# Patient Record
Sex: Male | Born: 1990 | Race: Black or African American | Hispanic: No | Marital: Single | Smoking: Current every day smoker
Health system: Southern US, Community
[De-identification: ages and names within clinical notes are randomized; demographics above are authoritative.]

## PROBLEM LIST (undated history)

## (undated) ENCOUNTER — Ambulatory Visit (HOSPITAL_COMMUNITY): Payer: Medicaid Other

## (undated) DIAGNOSIS — J45909 Unspecified asthma, uncomplicated: Secondary | ICD-10-CM

## (undated) DIAGNOSIS — I10 Essential (primary) hypertension: Secondary | ICD-10-CM

## (undated) DIAGNOSIS — E119 Type 2 diabetes mellitus without complications: Secondary | ICD-10-CM

---

## 2018-01-20 ENCOUNTER — Emergency Department (HOSPITAL_BASED_OUTPATIENT_CLINIC_OR_DEPARTMENT_OTHER)
Admission: EM | Admit: 2018-01-20 | Discharge: 2018-01-21 | Disposition: A | Discharge status: Home / Self Care | Payer: Medicaid Other | Attending: Emergency Medicine | Admitting: Emergency Medicine

## 2018-01-20 ENCOUNTER — Encounter (HOSPITAL_BASED_OUTPATIENT_CLINIC_OR_DEPARTMENT_OTHER): Payer: Self-pay | Admitting: *Deleted

## 2018-01-20 ENCOUNTER — Other Ambulatory Visit: Payer: Self-pay

## 2018-01-20 DIAGNOSIS — I10 Essential (primary) hypertension: Secondary | ICD-10-CM | POA: Diagnosis not present

## 2018-01-20 DIAGNOSIS — F17210 Nicotine dependence, cigarettes, uncomplicated: Secondary | ICD-10-CM | POA: Diagnosis not present

## 2018-01-20 DIAGNOSIS — Z794 Long term (current) use of insulin: Secondary | ICD-10-CM | POA: Insufficient documentation

## 2018-01-20 DIAGNOSIS — E119 Type 2 diabetes mellitus without complications: Secondary | ICD-10-CM | POA: Diagnosis not present

## 2018-01-20 DIAGNOSIS — L299 Pruritus, unspecified: Secondary | ICD-10-CM | POA: Diagnosis present

## 2018-01-20 HISTORY — DX: Type 2 diabetes mellitus without complications: E11.9

## 2018-01-20 HISTORY — DX: Essential (primary) hypertension: I10

## 2018-01-20 LAB — CBG MONITORING, ED
GLUCOSE-CAPILLARY: 194 mg/dL — AB (ref 65–99)
Glucose-Capillary: 244 mg/dL — ABNORMAL HIGH (ref 65–99)

## 2018-01-20 MED ORDER — ONDANSETRON HCL 4 MG/2ML IJ SOLN
4.0000 mg | Freq: Once | INTRAMUSCULAR | Status: AC
  Administered 2018-01-20: 4 mg via INTRAVENOUS
  Filled 2018-01-20: qty 2

## 2018-01-20 MED ORDER — DIPHENHYDRAMINE HCL 50 MG/ML IJ SOLN
25.0000 mg | Freq: Once | INTRAMUSCULAR | Status: AC
  Administered 2018-01-20: 25 mg via INTRAVENOUS
  Filled 2018-01-20: qty 1

## 2018-01-20 MED ORDER — SODIUM CHLORIDE 0.9 % IV BOLUS (SEPSIS)
1000.0000 mL | Freq: Once | INTRAVENOUS | Status: AC
  Administered 2018-01-20: 1000 mL via INTRAVENOUS

## 2018-01-20 NOTE — ED Provider Notes (Signed)
[facility] EMERGENCY DEPARTMENT Provider Note   CSN: [id number] Arrival date & time: 01/20/18  2007     History   Chief Complaint Chief Complaint  Patient presents with  . Pruritis    HPI Francisco Davila is a 27 y.o. male.  HPI   27 year old male with history of diabetes, hypertension presenting complaining of itchiness.  Patient states that he ate an [place] sandwich at lunch and shortly afterward he developed itchiness throughout his entire body.  Furthermore, he also has been feeling nauseous and has vomited multiple times.  Vomitus is nonbloody nonbilious.  He feels dehydrated.  Itchiness has improved but not fully resolved.  No headache, lightheadedness, dizziness, chest pain, shortness of breath but does endorse some mild abdominal discomfort from vomiting.  Admits that he has history of diabetes and felt that his blood sugar is elevated.  States he took his medication earlier but vomited up.  He denies any prior known allergies.  States that he has been compliant with his medication and his diabetes have been under control.  Past Medical History:  Diagnosis Date  . Diabetes mellitus without complication (HCC)   . Hypertension     There are no active problems to display for this patient.   History reviewed. No pertinent surgical history.     Home Medications    Prior to Admission medications   Medication Sig Start Date End Date Taking? Authorizing Provider  Insulin NPH Isophane & Regular (HUMULIN 70/30 [place]) Inject into the skin.   Yes [provider]  lisinopril (PRINIVIL,ZESTRIL) 20 MG tablet Take 20 mg by mouth daily.   Yes [provider]  metFORMIN (GLUCOPHAGE) 500 MG tablet Take 500 mg by mouth 2 (two) times daily with a meal.   Yes [provider]  simvastatin (ZOCOR) 40 MG tablet Take 40 mg by mouth daily.   Yes [provider]    Family History History reviewed. No pertinent family history.  Social  History Social History   Tobacco Use  . Smoking status: Current Every Day Smoker    Types: Cigarettes, Cigars  . Smokeless tobacco: Never Used  Substance Use Topics  . Alcohol use: No    Frequency: Never  . Drug use: Yes    Types: Marijuana     Allergies   Patient has no known allergies.   Review of Systems Review of Systems  All other systems reviewed and are negative.    Physical Exam Updated Vital Signs BP (!) 147/95 (BP Location: Right Arm)   Pulse (!) 107   Temp 98.6 F (37 C) (Oral)   Resp 20   Ht 5\' 11"  (1.803 m)   Wt 87.2 kg (192 lb 3.9 oz)   SpO2 98%   BMI 26.81 kg/m   Physical Exam  Constitutional: He appears well-developed and well-nourished. No distress.  HENT:  Head: Atraumatic.  Mouth is dry  Eyes: Conjunctivae are normal.  Neck: Neck supple.  Cardiovascular:  Mild tachycardia without murmur rubs or gallops  Pulmonary/Chest: Effort normal and breath sounds normal. He has no wheezes. He has no rales.  Abdominal: Soft. Bowel sounds are normal. He exhibits no distension. There is tenderness (Mild generalized abdominal tenderness without guarding or rebound tenderness.  Negative Murphy sign, no pain at McBurney's point.).  Neurological: He is alert.  Skin: No rash noted.  No urticarial rash  Psychiatric: He has a normal mood and affect.  Nursing note and vitals reviewed.    ED Treatments / Results  Labs (all labs ordered are listed, but only abnormal results are displayed) Labs Reviewed  CBG MONITORING, ED - Abnormal; Notable for the following components:      Result Value   Glucose-Capillary 244 (*)    All other components within normal limits  CBG MONITORING, ED - Abnormal; Notable for the following components:   Glucose-Capillary 194 (*)    All other components within normal limits    EKG  EKG Interpretation None       Radiology No results found.  Procedures Procedures (including critical care time)  Medications Ordered  in ED Medications  sodium chloride 0.9 % bolus 1,000 mL (0 mLs Intravenous Stopped 01/20/18 2323)  ondansetron (ZOFRAN) injection 4 mg (4 mg Intravenous Given 01/20/18 2218)  diphenhydrAMINE (BENADRYL) injection 25 mg (25 mg Intravenous Given 01/20/18 2218)     Initial Impression / Assessment and Plan / ED Course  I have reviewed the triage vital signs and the nursing notes.  Pertinent labs & imaging results that were available during my care of the patient were reviewed by me and considered in my medical decision making (see chart for details).     BP 132/79 (BP Location: Right Arm)   Pulse 96   Temp 100 F (37.8 C) (Oral)   Resp 18   Ht 5\' 11"  (1.803 m)   Wt 87.2 kg (192 lb 3.9 oz)   SpO2 96%   BMI 26.81 kg/m    Final Clinical Impressions(s) / ED Diagnoses   Final diagnoses:  Pruritus    ED Discharge Orders        Ordered    diphenhydrAMINE (BENADRYL) 25 MG tablet  Every 6 hours PRN     01/21/18 0009    ondansetron (ZOFRAN) 4 MG tablet  Every 8 hours PRN     01/21/18 0010     10:02 PM Patient complaining of itchiness after eating a intolerance in which earlier today.  No urticarial rash noted.  He does complain of abdominal discomfort with persistent vomiting.  No airway compromise.  I am unsure if this is related to an allergic reaction or related to his hyperglycemia as his CBG is 244.  I have low suspicion for anaphylactic shock causing abdominal cramping and vomiting.  Patient will be treated symptomatically with IV fluid, Benadryl, and Zofran and will monitor.  Low suspicion for biliary disease causing his symptoms.  His abdominal discomfort may be secondary to his hyperglycemia.  11:45 PM Pt given IVF, zofran, benadryl for his sxs.  He is currently resting comfortably.  sxs resolved.  CBG improves to 194.  No abd pain.     Fayrene Helper, PA-C 01/21/18 0011    Nira Conn, MD 01/21/18 [phone]

## 2018-01-20 NOTE — ED Triage Notes (Signed)
Pt c/o itching and rash x 8 hrs ago after eating sandwich for lunch. States he feels better now

## 2018-01-21 MED ORDER — DIPHENHYDRAMINE HCL 25 MG PO TABS: 25.0000 mg | ORAL_TABLET | Freq: Four times a day (QID) | ORAL | 0 refills | Status: AC | PRN

## 2018-01-21 MED ORDER — ONDANSETRON HCL 4 MG PO TABS: 4.0000 mg | ORAL_TABLET | Freq: Three times a day (TID) | ORAL | 0 refills | Status: DC | PRN

## 2018-01-21 NOTE — Discharge Instructions (Signed)
You have been evaluated for your itchiness.  It may have been caused by an allergic reaction. Please take benadryl as needed for itchiness.  Your blood sugar is elevated today, check your blood sugar regularly and take your diabetic medication as prescribed.  Take zofran as needed for nausea.  Return if you have any concerns.

## 2018-12-25 ENCOUNTER — Emergency Department (HOSPITAL_BASED_OUTPATIENT_CLINIC_OR_DEPARTMENT_OTHER)
Admission: EM | Admit: 2018-12-25 | Discharge: 2018-12-25 | Disposition: A | Discharge status: Home / Self Care | Payer: Medicaid Other | Attending: Emergency Medicine | Admitting: Emergency Medicine

## 2018-12-25 ENCOUNTER — Other Ambulatory Visit: Payer: Self-pay

## 2018-12-25 ENCOUNTER — Encounter (HOSPITAL_BASED_OUTPATIENT_CLINIC_OR_DEPARTMENT_OTHER): Payer: Self-pay | Admitting: *Deleted

## 2018-12-25 DIAGNOSIS — Z79899 Other long term (current) drug therapy: Secondary | ICD-10-CM | POA: Diagnosis not present

## 2018-12-25 DIAGNOSIS — K529 Noninfective gastroenteritis and colitis, unspecified: Secondary | ICD-10-CM

## 2018-12-25 DIAGNOSIS — I10 Essential (primary) hypertension: Secondary | ICD-10-CM | POA: Diagnosis not present

## 2018-12-25 DIAGNOSIS — Z7984 Long term (current) use of oral hypoglycemic drugs: Secondary | ICD-10-CM | POA: Diagnosis not present

## 2018-12-25 DIAGNOSIS — E119 Type 2 diabetes mellitus without complications: Secondary | ICD-10-CM | POA: Insufficient documentation

## 2018-12-25 DIAGNOSIS — F17210 Nicotine dependence, cigarettes, uncomplicated: Secondary | ICD-10-CM | POA: Insufficient documentation

## 2018-12-25 DIAGNOSIS — R112 Nausea with vomiting, unspecified: Secondary | ICD-10-CM | POA: Diagnosis present

## 2018-12-25 LAB — URINALYSIS, ROUTINE W REFLEX MICROSCOPIC
Bilirubin Urine: NEGATIVE
Glucose, UA: NEGATIVE mg/dL
Hgb urine dipstick: NEGATIVE
Ketones, ur: NEGATIVE mg/dL
Leukocytes, UA: NEGATIVE
NITRITE: NEGATIVE
PH: 5.5 (ref 5.0–8.0)
Protein, ur: 100 mg/dL — AB

## 2018-12-25 LAB — CBC
HCT: 51.9 % (ref 39.0–52.0)
HEMOGLOBIN: 15.5 g/dL (ref 13.0–17.0)
MCH: 22.5 pg — AB (ref 26.0–34.0)
MCHC: 29.9 g/dL — ABNORMAL LOW (ref 30.0–36.0)
MCV: 75.2 fL — ABNORMAL LOW (ref 80.0–100.0)
Platelets: 322 10*3/uL (ref 150–400)
RBC: 6.9 MIL/uL — AB (ref 4.22–5.81)
RDW: 17.7 % — ABNORMAL HIGH (ref 11.5–15.5)
WBC: 9.8 10*3/uL (ref 4.0–10.5)
nRBC: 0 % (ref 0.0–0.2)

## 2018-12-25 LAB — LIPASE, BLOOD: LIPASE: 21 U/L (ref 11–51)

## 2018-12-25 LAB — COMPREHENSIVE METABOLIC PANEL
ALBUMIN: 4.1 g/dL (ref 3.5–5.0)
ALT: 12 U/L (ref 0–44)
ANION GAP: 8 (ref 5–15)
AST: 16 U/L (ref 15–41)
Alkaline Phosphatase: 64 U/L (ref 38–126)
BILIRUBIN TOTAL: 0.7 mg/dL (ref 0.3–1.2)
BUN: 13 mg/dL (ref 6–20)
CO2: 26 mmol/L (ref 22–32)
Calcium: 8.9 mg/dL (ref 8.9–10.3)
Chloride: 104 mmol/L (ref 98–111)
Creatinine, Ser: 0.78 mg/dL (ref 0.61–1.24)
GFR calc Af Amer: >60 mL/min (ref >60)
GFR calc non Af Amer: >60 mL/min (ref >60)
GLUCOSE: 136 mg/dL — AB (ref 70–99)
POTASSIUM: 3.4 mmol/L — AB (ref 3.5–5.1)
Sodium: 138 mmol/L (ref 135–145)
TOTAL PROTEIN: 7.2 g/dL (ref 6.5–8.1)

## 2018-12-25 LAB — URINALYSIS, MICROSCOPIC (REFLEX)

## 2018-12-25 LAB — CBG MONITORING, ED: GLUCOSE-CAPILLARY: 90 mg/dL (ref 70–99)

## 2018-12-25 MED ORDER — SODIUM CHLORIDE 0.9% FLUSH
3.0000 mL | Freq: Once | INTRAVENOUS | Status: DC
  Filled 2018-12-25: qty 3

## 2018-12-25 MED ORDER — POTASSIUM CHLORIDE CRYS ER 20 MEQ PO TBCR
40.0000 meq | EXTENDED_RELEASE_TABLET | Freq: Once | ORAL | Status: AC
  Administered 2018-12-25: 40 meq via ORAL
  Filled 2018-12-25: qty 2

## 2018-12-25 MED ORDER — LIDOCAINE VISCOUS HCL 2 % MT SOLN
15.0000 mL | Freq: Once | OROMUCOSAL | Status: AC
Start: 2018-12-25 — End: 2018-12-25
  Administered 2018-12-25: 15 mL via ORAL
  Filled 2018-12-25: qty 15

## 2018-12-25 MED ORDER — SODIUM CHLORIDE 0.9 % IV BOLUS
1000.0000 mL | Freq: Once | INTRAVENOUS | Status: AC
  Administered 2018-12-25: 1000 mL via INTRAVENOUS

## 2018-12-25 MED ORDER — ALUM & MAG HYDROXIDE-SIMETH 200-200-20 MG/5ML PO SUSP
30.0000 mL | Freq: Once | ORAL | Status: AC
  Administered 2018-12-25: 30 mL via ORAL
  Filled 2018-12-25: qty 30

## 2018-12-25 MED ORDER — ONDANSETRON 4 MG PO TBDP: 4.0000 mg | ORAL_TABLET | Freq: Three times a day (TID) | ORAL | 0 refills | Status: DC | PRN

## 2018-12-25 MED ORDER — ONDANSETRON HCL 4 MG/2ML IJ SOLN
4.0000 mg | Freq: Once | INTRAMUSCULAR | Status: AC
  Administered 2018-12-25: 4 mg via INTRAVENOUS
  Filled 2018-12-25: qty 2

## 2018-12-25 MED ORDER — PANTOPRAZOLE SODIUM 40 MG PO TBEC: 40.0000 mg | DELAYED_RELEASE_TABLET | Freq: Every day | ORAL | 0 refills | Status: DC

## 2018-12-25 NOTE — ED Triage Notes (Signed)
Pt reports cold sx earlier this week. Reports upper abd pain, nausea, and diarrhea x 3 days.

## 2018-12-25 NOTE — ED Provider Notes (Signed)
[facility] EMERGENCY DEPARTMENT Provider Note   CSN: [id number] Arrival date & time: 12/25/18  2025     History   Chief Complaint Chief Complaint  Patient presents with  . Abdominal Pain    HPI Francisco Davila is a 28 y.o. male.  HPI  A 28 year old male presents with diarrhea, nausea and abdominal pain.  Started off with cold-like symptoms earlier in the week.  Starting 2 days ago he is developed the nausea without vomiting as well as cramping upper abdominal pain with numerous loose stools.  No blood or mucus.  No fevers or recent travel.  The abdominal pain seems to be constant.  Tried some Pepto-Bismol without relief.  Past Medical History:  Diagnosis Date  . Diabetes mellitus without complication (HCC)   . Hypertension     There are no active problems to display for this patient.   History reviewed. No pertinent surgical history.      Home Medications    Prior to Admission medications   Medication Sig Start Date End Date Taking? Authorizing Provider  metFORMIN (GLUCOPHAGE) 1000 MG tablet Take by mouth. 10/30/16  Yes [provider]  diphenhydrAMINE (BENADRYL) 25 MG tablet Take 1 tablet (25 mg total) by mouth every 6 (six) hours as needed for itching or allergies. 01/21/18   Fayrene Helperran, [place], PA-C  Insulin NPH Isophane & Regular (HUMULIN 70/30 [place]) Inject into the skin.    [provider]  lisinopril (PRINIVIL,ZESTRIL) 20 MG tablet Take 20 mg by mouth daily.    [provider]  metFORMIN (GLUCOPHAGE) 500 MG tablet Take 500 mg by mouth 2 (two) times daily with a meal.    [provider]  ondansetron (ZOFRAN ODT) 4 MG disintegrating tablet Take 1 tablet (4 mg total) by mouth every 8 (eight) hours as needed. 12/25/18   Pricilla LovelessGoldston, Maicol Bowland, MD  ondansetron (ZOFRAN) 4 MG tablet Take 1 tablet (4 mg total) by mouth every 8 (eight) hours as needed for nausea or vomiting. 01/21/18   Fayrene Helperran, [place], PA-C  pantoprazole (PROTONIX) 40 MG tablet Take  1 tablet (40 mg total) by mouth daily. 12/25/18   Pricilla LovelessGoldston, Dillard Pascal, MD  simvastatin (ZOCOR) 40 MG tablet Take 40 mg by mouth daily.    [provider]    Family History No family history on file.  Social History Social History   Tobacco Use  . Smoking status: Current Every Day Smoker    Types: Cigars  . Smokeless tobacco: Never Used  Substance Use Topics  . Alcohol use: Yes    Frequency: Never  . Drug use: Not Currently    Types: Marijuana     Allergies   Patient has no known allergies.   Review of Systems Review of Systems  Constitutional: Negative for fever.  Gastrointestinal: Positive for abdominal pain, diarrhea and nausea. Negative for blood in stool and vomiting.  All other systems reviewed and are negative.    Physical Exam Updated Vital Signs BP 126/83 (BP Location: Left Arm)   Pulse 70   Temp 98.5 F (36.9 C) (Oral)   Resp 16   Ht 6' (1.829 m)   Wt 84.8 kg   SpO2 100%   BMI 25.36 kg/m   Physical Exam Vitals signs and nursing note reviewed.  Constitutional:      Appearance: He is well-developed.  HENT:     Head: Normocephalic and atraumatic.     Right Ear: External ear normal.     Left Ear: External ear normal.  Nose: Nose normal.  Eyes:     General:        Right eye: No discharge.        Left eye: No discharge.  Neck:     Musculoskeletal: Neck supple.  Cardiovascular:     Rate and Rhythm: Normal rate and regular rhythm.     Heart sounds: Normal heart sounds.  Pulmonary:     Effort: Pulmonary effort is normal.     Breath sounds: Normal breath sounds.  Abdominal:     Palpations: Abdomen is soft.     Tenderness: There is abdominal tenderness (mild) in the epigastric area.  Skin:    General: Skin is warm and dry.  Neurological:     Mental Status: He is alert.  Psychiatric:        Mood and Affect: Mood is not anxious.      ED Treatments / Results  Labs (all labs ordered are listed, but only abnormal results are  displayed) Labs Reviewed  COMPREHENSIVE METABOLIC PANEL - Abnormal; Notable for the following components:      Result Value   Potassium 3.4 (*)    Glucose, Bld 136 (*)    All other components within normal limits  CBC - Abnormal; Notable for the following components:   RBC 6.90 (*)    MCV 75.2 (*)    MCH 22.5 (*)    MCHC 29.9 (*)    RDW 17.7 (*)    All other components within normal limits  URINALYSIS, ROUTINE W REFLEX MICROSCOPIC - Abnormal; Notable for the following components:   Specific Gravity, Urine >1.030 (*)    Protein, ur 100 (*)    All other components within normal limits  URINALYSIS, MICROSCOPIC (REFLEX) - Abnormal; Notable for the following components:   Bacteria, UA RARE (*)    All other components within normal limits  LIPASE, BLOOD  CBG MONITORING, ED    EKG None  Radiology No results found.  Procedures Procedures (including critical care time)  Medications Ordered in ED Medications  sodium chloride flush (NS) 0.9 % injection 3 mL (3 mLs Intravenous Not Given 12/25/18 2119)  sodium chloride 0.9 % bolus 1,000 mL (0 mLs Intravenous Stopped 12/25/18 2241)  ondansetron (ZOFRAN) injection 4 mg (4 mg Intravenous Given 12/25/18 2138)  alum & mag hydroxide-simeth (MAALOX/MYLANTA) 200-200-20 MG/5ML suspension 30 mL (30 mLs Oral Given 12/25/18 2138)    And  lidocaine (XYLOCAINE) 2 % viscous mouth solution 15 mL (15 mLs Oral Given 12/25/18 2138)  potassium chloride SA (K-DUR,KLOR-CON) CR tablet 40 mEq (40 mEq Oral Given 12/25/18 2225)     Initial Impression / Assessment and Plan / ED Course  I have reviewed the triage vital signs and the nursing notes.  Pertinent labs & imaging results that were available during my care of the patient were reviewed by me and considered in my medical decision making (see chart for details).     Patient's feeling better with GI cocktail and Zofran.  No vomiting in the emergency department.  His vitals are stable.  Labs are overall  reassuring besides minimal hypokalemia.  This was repleted without difficulty.  He has some mild upper abdominal tenderness but no focal lower abdominal tenderness to be concern for appendicitis.  My suspicion is this is a viral GI process and will treat with supportive care.  However we discussed return precautions.  Final Clinical Impressions(s) / ED Diagnoses   Final diagnoses:  Acute gastroenteritis    ED Discharge Orders  Ordered    ondansetron (ZOFRAN ODT) 4 MG disintegrating tablet  Every 8 hours PRN     12/25/18 2243    pantoprazole (PROTONIX) 40 MG tablet  Daily     12/25/18 2243           Pricilla Loveless, MD 12/25/18 2257

## 2018-12-25 NOTE — Discharge Instructions (Signed)
If you develop worsening, continued, or recurrent abdominal pain, uncontrolled vomiting, fever, chest or back pain, or any other new/concerning symptoms then return to the ER for evaluation.  

## 2020-04-11 ENCOUNTER — Emergency Department (HOSPITAL_BASED_OUTPATIENT_CLINIC_OR_DEPARTMENT_OTHER)
Admission: EM | Admit: 2020-04-11 | Discharge: 2020-04-11 | Disposition: A | Discharge status: Home / Self Care | Payer: Medicaid Other | Attending: Emergency Medicine | Admitting: Emergency Medicine

## 2020-04-11 ENCOUNTER — Encounter (HOSPITAL_BASED_OUTPATIENT_CLINIC_OR_DEPARTMENT_OTHER): Payer: Self-pay

## 2020-04-11 ENCOUNTER — Other Ambulatory Visit: Payer: Self-pay

## 2020-04-11 ENCOUNTER — Emergency Department (HOSPITAL_BASED_OUTPATIENT_CLINIC_OR_DEPARTMENT_OTHER): Payer: Medicaid Other

## 2020-04-11 DIAGNOSIS — R531 Weakness: Secondary | ICD-10-CM | POA: Diagnosis not present

## 2020-04-11 DIAGNOSIS — Z20822 Contact with and (suspected) exposure to covid-19: Secondary | ICD-10-CM | POA: Diagnosis not present

## 2020-04-11 DIAGNOSIS — Z794 Long term (current) use of insulin: Secondary | ICD-10-CM | POA: Insufficient documentation

## 2020-04-11 DIAGNOSIS — R05 Cough: Secondary | ICD-10-CM | POA: Insufficient documentation

## 2020-04-11 DIAGNOSIS — E119 Type 2 diabetes mellitus without complications: Secondary | ICD-10-CM | POA: Diagnosis not present

## 2020-04-11 DIAGNOSIS — Z79899 Other long term (current) drug therapy: Secondary | ICD-10-CM | POA: Insufficient documentation

## 2020-04-11 DIAGNOSIS — I10 Essential (primary) hypertension: Secondary | ICD-10-CM | POA: Insufficient documentation

## 2020-04-11 DIAGNOSIS — R059 Cough, unspecified: Secondary | ICD-10-CM

## 2020-04-11 DIAGNOSIS — F17210 Nicotine dependence, cigarettes, uncomplicated: Secondary | ICD-10-CM | POA: Insufficient documentation

## 2020-04-11 LAB — CBC WITH DIFFERENTIAL/PLATELET
Abs Immature Granulocytes: 0.02 10*3/uL (ref 0.00–0.07)
Basophils Absolute: 0 10*3/uL (ref 0.0–0.1)
Basophils Relative: 0 %
Eosinophils Absolute: 0.2 10*3/uL (ref 0.0–0.5)
Eosinophils Relative: 2 %
HCT: 49.4 % (ref 39.0–52.0)
Hemoglobin: 15.8 g/dL (ref 13.0–17.0)
Immature Granulocytes: 0 %
Lymphocytes Relative: 25 %
Lymphs Abs: 2 10*3/uL (ref 0.7–4.0)
MCH: 23.7 pg — ABNORMAL LOW (ref 26.0–34.0)
MCHC: 32 g/dL (ref 30.0–36.0)
MCV: 74 fL — ABNORMAL LOW (ref 80.0–100.0)
Monocytes Absolute: 0.6 10*3/uL (ref 0.1–1.0)
Monocytes Relative: 7 %
Neutro Abs: 5.5 10*3/uL (ref 1.7–7.7)
Neutrophils Relative %: 66 %
Platelets: 323 10*3/uL (ref 150–400)
RBC: 6.68 MIL/uL — ABNORMAL HIGH (ref 4.22–5.81)
RDW: 14.9 % (ref 11.5–15.5)
WBC: 8.3 10*3/uL (ref 4.0–10.5)
nRBC: 0 % (ref 0.0–0.2)

## 2020-04-11 LAB — COMPREHENSIVE METABOLIC PANEL
ALT: 15 U/L (ref 0–44)
AST: 15 U/L (ref 15–41)
Albumin: 3.9 g/dL (ref 3.5–5.0)
Alkaline Phosphatase: 85 U/L (ref 38–126)
Anion gap: 7 (ref 5–15)
BUN: 10 mg/dL (ref 6–20)
CO2: 27 mmol/L (ref 22–32)
Calcium: 9.1 mg/dL (ref 8.9–10.3)
Chloride: 104 mmol/L (ref 98–111)
Creatinine, Ser: 0.88 mg/dL (ref 0.61–1.24)
GFR calc Af Amer: >60 mL/min (ref >60)
GFR calc non Af Amer: >60 mL/min (ref >60)
Glucose, Bld: 154 mg/dL — ABNORMAL HIGH (ref 70–99)
Potassium: 3.4 mmol/L — ABNORMAL LOW (ref 3.5–5.1)
Sodium: 138 mmol/L (ref 135–145)
Total Bilirubin: 0.7 mg/dL (ref 0.3–1.2)
Total Protein: 7.4 g/dL (ref 6.5–8.1)

## 2020-04-11 LAB — CBG MONITORING, ED: Glucose-Capillary: 183 mg/dL — ABNORMAL HIGH (ref 70–99)

## 2020-04-11 LAB — SARS CORONAVIRUS 2 BY RT PCR (HOSPITAL ORDER, PERFORMED IN ~~LOC~~ HOSPITAL LAB): SARS Coronavirus 2: NEGATIVE

## 2020-04-11 MED ORDER — SODIUM CHLORIDE 0.9 % IV BOLUS
1000.0000 mL | Freq: Once | INTRAVENOUS | Status: AC
  Administered 2020-04-11: 13:00:00 1000 mL via INTRAVENOUS

## 2020-04-11 NOTE — ED Triage Notes (Signed)
Pt c/o cough x "weeks"-c/o fatigue today-NAD-steady gait

## 2020-04-11 NOTE — ED Notes (Signed)
Pt aware of need for urine specimen, unable to provide at this time, IV fluids infusing. 

## 2020-04-11 NOTE — ED Provider Notes (Signed)
[facility] EMERGENCY DEPARTMENT Provider Note   CSN: [id number] Arrival date & time: 04/11/20  1127     History Chief Complaint  Patient presents with  . Cough    Francisco Davila is a 29 y.o. male.  HPI      Feeling weak starting today Cough because of pollen for about one month Feeling fatigued, weak all over Glucose has been ok No urinary symptoms, no n/v/abd pain No fevers No body aches No known sick contacts Has had headaches, one yesterday, not current Appetite has been pretty good, tasting everything Hx of asthma Cough seems unchanged   Past Medical History:  Diagnosis Date  . Diabetes mellitus without complication (HCC)   . Hypertension     There are no problems to display for this patient.   History reviewed. No pertinent surgical history.     No family history on file.  Social History   Tobacco Use  . Smoking status: Current Every Day Smoker    Types: Cigars  . Smokeless tobacco: Never Used  Substance Use Topics  . Alcohol use: Yes    Comment: occ  . Drug use: Yes    Types: Marijuana    Home Medications Prior to Admission medications   Medication Sig Start Date End Date Taking? Authorizing Provider  diphenhydrAMINE (BENADRYL) 25 MG tablet Take 1 tablet (25 mg total) by mouth every 6 (six) hours as needed for itching or allergies. 01/21/18   Fayrene Helper, PA-C  Insulin NPH Isophane & Regular (HUMULIN 70/30 [place]) Inject into the skin.    [provider]  lisinopril (PRINIVIL,ZESTRIL) 20 MG tablet Take 20 mg by mouth daily.    [provider]  metFORMIN (GLUCOPHAGE) 1000 MG tablet Take by mouth. 10/30/16   [provider]  metFORMIN (GLUCOPHAGE) 500 MG tablet Take 500 mg by mouth 2 (two) times daily with a meal.    [provider]  ondansetron (ZOFRAN ODT) 4 MG disintegrating tablet Take 1 tablet (4 mg total) by mouth every 8 (eight) hours as needed. 12/25/18   Pricilla Loveless, MD  ondansetron  (ZOFRAN) 4 MG tablet Take 1 tablet (4 mg total) by mouth every 8 (eight) hours as needed for nausea or vomiting. 01/21/18   Fayrene Helper, PA-C  pantoprazole (PROTONIX) 40 MG tablet Take 1 tablet (40 mg total) by mouth daily. 12/25/18   Pricilla Loveless, MD  simvastatin (ZOCOR) 40 MG tablet Take 40 mg by mouth daily.    [provider]    Allergies    Patient has no known allergies.  Review of Systems   Review of Systems  Constitutional: Positive for fatigue. Negative for appetite change and fever.  HENT: Positive for congestion. Negative for sore throat.   Eyes: Negative for visual disturbance.  Respiratory: Positive for cough. Negative for shortness of breath and wheezing.   Cardiovascular: Negative for chest pain.  Gastrointestinal: Negative for abdominal pain, diarrhea, nausea and vomiting.  Genitourinary: Negative for dysuria.  Musculoskeletal: Negative for arthralgias and myalgias.  Skin: Negative for rash.  Neurological: Positive for headaches.    Physical Exam Updated Vital Signs BP (!) 129/93   Pulse 62   Temp 97.9 F (36.6 C) (Oral)   Resp 20   Ht 6' (1.829 m)   Wt 82.1 kg   SpO2 98%   BMI 24.55 kg/m   Physical Exam Vitals and nursing note reviewed.  Constitutional:      General: He is not in acute distress.  Appearance: He is well-developed. He is not diaphoretic.  HENT:     Head: Normocephalic and atraumatic.  Eyes:     Conjunctiva/sclera: Conjunctivae normal.  Cardiovascular:     Rate and Rhythm: Normal rate and regular rhythm.     Heart sounds: Normal heart sounds. No murmur. No friction rub. No gallop.   Pulmonary:     Effort: Pulmonary effort is normal. No respiratory distress.     Breath sounds: Normal breath sounds. No wheezing or rales.  Abdominal:     General: There is no distension.     Palpations: Abdomen is soft.     Tenderness: There is no abdominal tenderness. There is no guarding.  Musculoskeletal:     Cervical back: Normal range  of motion.  Skin:    General: Skin is warm and dry.  Neurological:     Mental Status: He is alert and oriented to person, place, and time.     ED Results / Procedures / Treatments   Labs (all labs ordered are listed, but only abnormal results are displayed) Labs Reviewed  CBC WITH DIFFERENTIAL/PLATELET - Abnormal; Notable for the following components:      Result Value   RBC 6.68 (*)    MCV 74.0 (*)    MCH 23.7 (*)    All other components within normal limits  COMPREHENSIVE METABOLIC PANEL - Abnormal; Notable for the following components:   Potassium 3.4 (*)    Glucose, Bld 154 (*)    All other components within normal limits  CBG MONITORING, ED - Abnormal; Notable for the following components:   Glucose-Capillary 183 (*)    All other components within normal limits  SARS CORONAVIRUS 2 BY RT PCR (HOSPITAL ORDER, [facility])    EKG None  Radiology DG Chest Port 1 View  Result Date: 04/11/2020 CLINICAL DATA:  Cough, weakness EXAM: PORTABLE CHEST 1 VIEW COMPARISON:  08/03/2018 FINDINGS: The heart size and mediastinal contours are within normal limits. Both lungs are clear. The visualized skeletal structures are unremarkable. IMPRESSION: No active disease. Electronically Signed   By: Davina Poke D.O.   On: 04/11/2020 12:26    Procedures Procedures (including critical care time)  Medications Ordered in ED Medications  sodium chloride 0.9 % bolus 1,000 mL (0 mLs Intravenous Stopped 04/11/20 1400)    ED Course  I have reviewed the triage vital signs and the nursing notes.  Pertinent labs & imaging results that were available during my care of the patient were reviewed by me and considered in my medical decision making (see chart for details).    MDM Rules/Calculators/A&P                      29yo male with history of DM, htn, presents with concern for weeks of cough and generalized weakness beginning today.  Differential diagnosis includes  anemia, electrolyte abnormality, cardiac abnormality, infection, hypothyroidism, other toxic/metabolic abnormalities.  No focal neurologic concerns on history or exam to suggest CVA, ICH or other central etiology.  Pt hemodynamically stable, afebrile, no urinary symptoms. Chest x-ray shows no sign of pneumonia or edema. . CBC showed no sign of anemia.  Electrolytes without significant abnormalities, no sign of DKA.  EKG without acute findings, no CP or dyspnea, low suspicion for cardiac etiology of symptoms.  Likely viral etiology of fatigue, cough. COVID testing negative. Recommend follow up with PCP regarding symptoms.    Final Clinical Impression(s) / ED Diagnoses Final diagnoses:  Generalized weakness  Cough    Rx / DC Orders ED Discharge Orders    None       Alvira Monday, MD 04/11/20 2201

## 2020-06-11 ENCOUNTER — Emergency Department (HOSPITAL_BASED_OUTPATIENT_CLINIC_OR_DEPARTMENT_OTHER): Payer: Medicaid Other

## 2020-06-11 ENCOUNTER — Encounter (HOSPITAL_BASED_OUTPATIENT_CLINIC_OR_DEPARTMENT_OTHER): Payer: Self-pay | Admitting: *Deleted

## 2020-06-11 ENCOUNTER — Emergency Department (HOSPITAL_BASED_OUTPATIENT_CLINIC_OR_DEPARTMENT_OTHER)
Admission: EM | Admit: 2020-06-11 | Discharge: 2020-06-11 | Disposition: A | Discharge status: Home / Self Care | Payer: Medicaid Other | Attending: Emergency Medicine | Admitting: Emergency Medicine

## 2020-06-11 ENCOUNTER — Other Ambulatory Visit: Payer: Self-pay

## 2020-06-11 DIAGNOSIS — W19XXXA Unspecified fall, initial encounter: Secondary | ICD-10-CM

## 2020-06-11 DIAGNOSIS — I10 Essential (primary) hypertension: Secondary | ICD-10-CM | POA: Diagnosis not present

## 2020-06-11 DIAGNOSIS — S4991XA Unspecified injury of right shoulder and upper arm, initial encounter: Secondary | ICD-10-CM | POA: Diagnosis not present

## 2020-06-11 DIAGNOSIS — Z720 Tobacco use: Secondary | ICD-10-CM | POA: Insufficient documentation

## 2020-06-11 DIAGNOSIS — Z7984 Long term (current) use of oral hypoglycemic drugs: Secondary | ICD-10-CM | POA: Diagnosis not present

## 2020-06-11 DIAGNOSIS — E119 Type 2 diabetes mellitus without complications: Secondary | ICD-10-CM | POA: Insufficient documentation

## 2020-06-11 DIAGNOSIS — Y929 Unspecified place or not applicable: Secondary | ICD-10-CM | POA: Insufficient documentation

## 2020-06-11 DIAGNOSIS — W010XXA Fall on same level from slipping, tripping and stumbling without subsequent striking against object, initial encounter: Secondary | ICD-10-CM | POA: Diagnosis not present

## 2020-06-11 DIAGNOSIS — Y999 Unspecified external cause status: Secondary | ICD-10-CM | POA: Diagnosis not present

## 2020-06-11 DIAGNOSIS — J45909 Unspecified asthma, uncomplicated: Secondary | ICD-10-CM | POA: Insufficient documentation

## 2020-06-11 DIAGNOSIS — Z79899 Other long term (current) drug therapy: Secondary | ICD-10-CM | POA: Diagnosis not present

## 2020-06-11 DIAGNOSIS — Y9389 Activity, other specified: Secondary | ICD-10-CM | POA: Insufficient documentation

## 2020-06-11 HISTORY — DX: Unspecified asthma, uncomplicated: J45.909

## 2020-06-11 MED ORDER — ALBUTEROL SULFATE HFA 108 (90 BASE) MCG/ACT IN AERS: 1.0000 | INHALATION_SPRAY | Freq: Four times a day (QID) | RESPIRATORY_TRACT | 0 refills | Status: AC | PRN

## 2020-06-11 MED ORDER — IBUPROFEN 400 MG PO TABS
600.0000 mg | ORAL_TABLET | Freq: Once | ORAL | Status: AC
  Administered 2020-06-11: 600 mg via ORAL
  Filled 2020-06-11: qty 1

## 2020-06-11 MED ORDER — ALBUTEROL SULFATE HFA 108 (90 BASE) MCG/ACT IN AERS
2.0000 | INHALATION_SPRAY | Freq: Once | RESPIRATORY_TRACT | Status: AC
  Administered 2020-06-11: 2 via RESPIRATORY_TRACT
  Filled 2020-06-11: qty 6.7

## 2020-06-11 NOTE — ED Triage Notes (Signed)
He slipped and fell 2 days ago. Pain in the right side of his neck. Ambulatory. Limited ROM.

## 2020-06-11 NOTE — ED Provider Notes (Signed)
[facility] EMERGENCY DEPARTMENT Provider Note   CSN: [id number] Arrival date & time: 06/11/20  1437     History Chief Complaint  Patient presents with  . Fall    Francisco Davila is a 29 y.o. male.  HPI   Patient presents to the emergency department with chief complaint of right shoulder pain that started 2 days ago.  Patient explains while he was outside playing with his friends he tripped and fell landing on his right shoulder.  He states later that day he started to have right shoulder pain and describes the pain as a constant throbbing pain that is worsened with movement and feels better when it is resting.  He denies hitting his head, losing consciousness, being on blood thinners, paresthesias in his right arm.  He has significant medical history of asthma, diabetes, hypertension, he denies headache, fever, shortness of breath, chest pain, abdominal pain, nausea, vomiting, dysuria, pedal edema.  Past Medical History:  Diagnosis Date  . Asthma   . Diabetes mellitus without complication (HCC)   . Hypertension     There are no problems to display for this patient.   History reviewed. No pertinent surgical history.     No family history on file.  Social History   Tobacco Use  . Smoking status: Current Every Day Smoker    Types: Cigars  . Smokeless tobacco: Never Used  Vaping Use  . Vaping Use: Never used  Substance Use Topics  . Alcohol use: Yes    Comment: occ  . Drug use: Yes    Types: Marijuana    Home Medications Prior to Admission medications   Medication Sig Start Date End Date Taking? Authorizing Provider  albuterol (VENTOLIN HFA) 108 (90 Base) MCG/ACT inhaler Inhale 1-2 puffs into the lungs every 6 (six) hours as needed for wheezing or shortness of breath. 06/11/20   Carroll Sage, PA-C  diphenhydrAMINE (BENADRYL) 25 MG tablet Take 1 tablet (25 mg total) by mouth every 6 (six) hours as needed for itching or allergies. 01/21/18   Fayrene Helper, PA-C  Insulin NPH Isophane & Regular (HUMULIN 70/30 [place]) Inject into the skin.    [provider]  lisinopril (PRINIVIL,ZESTRIL) 20 MG tablet Take 20 mg by mouth daily.    [provider]  metFORMIN (GLUCOPHAGE) 1000 MG tablet Take by mouth. 10/30/16   [provider]  metFORMIN (GLUCOPHAGE) 500 MG tablet Take 500 mg by mouth 2 (two) times daily with a meal.    [provider]  ondansetron (ZOFRAN ODT) 4 MG disintegrating tablet Take 1 tablet (4 mg total) by mouth every 8 (eight) hours as needed. 12/25/18   Pricilla Loveless, MD  ondansetron (ZOFRAN) 4 MG tablet Take 1 tablet (4 mg total) by mouth every 8 (eight) hours as needed for nausea or vomiting. 01/21/18   Fayrene Helper, PA-C  pantoprazole (PROTONIX) 40 MG tablet Take 1 tablet (40 mg total) by mouth daily. 12/25/18   Pricilla Loveless, MD  simvastatin (ZOCOR) 40 MG tablet Take 40 mg by mouth daily.    [provider]    Allergies    Patient has no known allergies.  Review of Systems   Review of Systems  Constitutional: Negative for chills and fever.  HENT: Negative for congestion and sore throat.   Eyes: Negative for visual disturbance.  Respiratory: Negative for shortness of breath.   Cardiovascular: Negative for chest pain.  Gastrointestinal: Negative for abdominal pain.  Genitourinary: Negative for enuresis.  Musculoskeletal: Negative for back pain.       Admits to right shoulder pain.  Skin: Negative for rash.  Neurological: Negative for dizziness, facial asymmetry and headaches.  Hematological: Does not bruise/bleed easily.    Physical Exam Updated Vital Signs BP 137/87 (BP Location: Left Arm)   Pulse 63   Temp 98 F (36.7 C) (Oral)   Resp 18   Ht 6' (1.829 m)   Wt 82.1 kg   SpO2 97%   BMI 24.55 kg/m   Physical Exam Vitals and nursing note reviewed.  Constitutional:      General: He is not in acute distress.    Appearance: Normal appearance. He is not ill-appearing  or diaphoretic.  HENT:     Head: Normocephalic and atraumatic.     Nose: No congestion or rhinorrhea.  Eyes:     General: No scleral icterus.       Right eye: No discharge.        Left eye: No discharge.     Conjunctiva/sclera: Conjunctivae normal.  Pulmonary:     Effort: Pulmonary effort is normal. No respiratory distress.     Breath sounds: Normal breath sounds. No wheezing.  Musculoskeletal:     Cervical back: Neck supple.     Right lower leg: No edema.     Left lower leg: No edema.     Comments: Right shoulder was visualized, there is no erythema, edema, abrasions, lacerations, or other gross abnormalities noted.  Patient had pain to palpation along his scapula, he has full range of motion of his right shoulder, 5-5 strength, good radial pulses, good capillary refill, sensation fully intact.  Skin:    General: Skin is warm and dry.     Coloration: Skin is not jaundiced or pale.  Neurological:     Mental Status: He is alert and oriented to person, place, and time.  Psychiatric:        Mood and Affect: Mood normal.     ED Results / Procedures / Treatments   Labs (all labs ordered are listed, but only abnormal results are displayed) Labs Reviewed - No data to display  EKG None  Radiology DG Shoulder Right  Result Date: 06/11/2020 CLINICAL DATA:  29 year old male with right shoulder pain. EXAM: RIGHT SHOULDER - 2+ VIEW COMPARISON:  Chest radiograph dated 04/11/2020. FINDINGS: There is no evidence of fracture or dislocation. There is no evidence of arthropathy or other focal bone abnormality. Soft tissues are unremarkable. IMPRESSION: Negative. Electronically Signed   By: Elgie Collard M.D.   On: 06/11/2020 19:41    Procedures Procedures (including critical care time)  Medications Ordered in ED Medications  ibuprofen (ADVIL) tablet 600 mg (600 mg Oral Given 06/11/20 1921)  albuterol (VENTOLIN HFA) 108 (90 Base) MCG/ACT inhaler 2 puff (2 puffs Inhalation Given 06/11/20  1935)    ED Course  I have reviewed the triage vital signs and the nursing notes.  Pertinent labs & imaging results that were available during my care of the patient were reviewed by me and considered in my medical decision making (see chart for details).    MDM Rules/Calculators/A&P                          I have personally reviewed all imaging, labs and have interpreted them.  Unlikely patient suffering from compartment syndrome as he denies paresthesia, neurovascular is fully intact, good capillary refill, good radial pulses.  Unlikely patient suffering from  a spinal fracture as spine was visualized, no gross abnormalities noted, no point tenderness along palpation, no step-off felt.  Unlikely patient suffering from intracranial bleed as patient denies hitting his head, losing conscious, denies headache, visual changes, no battle signs or raccoon eyes noted.  X-ray of patient's shoulder was performed did not show any acute abnormalities, no fractures or dislocations noted.  Possible patient suffering from muscle strain or ligament tear/strain.  Patient was nontoxic-appearing, vital signs reassuring, exam was benign further imaging and lab work were not indicated.  Patient was given a refill of his inhaler as he states he is out of his rescue inhaler.  Patient is resting calmly bed showing no acute signs distress.  Vital signs have remained stable, does not meet criteria to be admitted to the hospital.  Likely patient suffering from a muscle strain but differential diagnosis includes ligament strain/tear vs tendon strain/tear recommend patient take over-the-counter pain medication and follows up with orthopedics 2 to 3 weeks if pain does not improve.  Patient was discussed with attending who agrees with assessment and plan.  He was given at home care and strict return precautions.  Patient verbalized that he understood agrees the plan. Final Clinical Impression(s) / ED Diagnoses Final diagnoses:   Fall, initial encounter  Injury of right shoulder, initial encounter    Rx / DC Orders ED Discharge Orders         Ordered    albuterol (VENTOLIN HFA) 108 (90 Base) MCG/ACT inhaler  Every 6 hours PRN     Discontinue  Reprint     06/11/20 1948           Carroll Sage, PA-C 06/11/20 2335    Charlynne Pander, MD 06/12/20 1505

## 2020-06-11 NOTE — ED Notes (Signed)
ED Provider at bedside. 

## 2020-06-11 NOTE — Discharge Instructions (Addendum)
You have been seen here for a shoulder injury.  Imaging and labs look reassuring.  I want you to alternate between taking ibuprofen and Tylenol every 6 hours for pain.  For example you can take ibuprofen wait 6 hours then take Tylenol wait another 6 hours and repeat.  Please follow dosage and on back of bottle.  I want you to apply ice to the area as this can help with inflammation and swelling.  Please do not place ice on bare skin as this can cause a burn.  You may do this for 2 days then I want you to switch and place heat on the area and stretch out the muscles as this can keep your arm from becoming stiff.  I want you to follow-up with a primary care doctor in 2 weeks if symptoms are not getting better.  I have given you information for [facility] they work with individuals with little to no insurance please follow-up with them to find a primary care provider.   I want you to come back to the emergency department if you develop numbness or tingling in your arm, severe pain, shortness of breath, chest pain, uncontrolled nausea, vomiting, diarrhea, fever as you symptoms require further evaluation and management.

## 2020-06-11 NOTE — Progress Notes (Signed)
Patient's ID badge did not scan.  RT confirmed ID by patient confirming his name and birth date.

## 2020-08-04 ENCOUNTER — Emergency Department (HOSPITAL_BASED_OUTPATIENT_CLINIC_OR_DEPARTMENT_OTHER)
Admission: EM | Admit: 2020-08-04 | Discharge: 2020-08-04 | Disposition: A | Discharge status: Home / Self Care | Payer: Medicaid Other | Attending: Emergency Medicine | Admitting: Emergency Medicine

## 2020-08-04 ENCOUNTER — Encounter (HOSPITAL_BASED_OUTPATIENT_CLINIC_OR_DEPARTMENT_OTHER): Payer: Self-pay | Admitting: Emergency Medicine

## 2020-08-04 ENCOUNTER — Other Ambulatory Visit: Payer: Self-pay

## 2020-08-04 ENCOUNTER — Emergency Department (HOSPITAL_BASED_OUTPATIENT_CLINIC_OR_DEPARTMENT_OTHER): Payer: Medicaid Other

## 2020-08-04 DIAGNOSIS — F1590 Other stimulant use, unspecified, uncomplicated: Secondary | ICD-10-CM | POA: Diagnosis not present

## 2020-08-04 DIAGNOSIS — R079 Chest pain, unspecified: Secondary | ICD-10-CM | POA: Diagnosis not present

## 2020-08-04 DIAGNOSIS — R202 Paresthesia of skin: Secondary | ICD-10-CM | POA: Insufficient documentation

## 2020-08-04 DIAGNOSIS — Z7984 Long term (current) use of oral hypoglycemic drugs: Secondary | ICD-10-CM | POA: Insufficient documentation

## 2020-08-04 DIAGNOSIS — Z79899 Other long term (current) drug therapy: Secondary | ICD-10-CM | POA: Diagnosis not present

## 2020-08-04 DIAGNOSIS — E119 Type 2 diabetes mellitus without complications: Secondary | ICD-10-CM | POA: Insufficient documentation

## 2020-08-04 DIAGNOSIS — J45909 Unspecified asthma, uncomplicated: Secondary | ICD-10-CM | POA: Insufficient documentation

## 2020-08-04 DIAGNOSIS — I10 Essential (primary) hypertension: Secondary | ICD-10-CM | POA: Diagnosis not present

## 2020-08-04 DIAGNOSIS — F17290 Nicotine dependence, other tobacco product, uncomplicated: Secondary | ICD-10-CM | POA: Insufficient documentation

## 2020-08-04 LAB — COMPREHENSIVE METABOLIC PANEL
ALT: 18 U/L (ref 0–44)
AST: 17 U/L (ref 15–41)
Albumin: 4 g/dL (ref 3.5–5.0)
Alkaline Phosphatase: 70 U/L (ref 38–126)
Anion gap: 14 (ref 5–15)
BUN: 12 mg/dL (ref 6–20)
CO2: 24 mmol/L (ref 22–32)
Calcium: 9.1 mg/dL (ref 8.9–10.3)
Chloride: 99 mmol/L (ref 98–111)
Creatinine, Ser: 1.04 mg/dL (ref 0.61–1.24)
GFR calc Af Amer: >60 mL/min (ref >60)
GFR calc non Af Amer: >60 mL/min (ref >60)
Glucose, Bld: 311 mg/dL — ABNORMAL HIGH (ref 70–99)
Potassium: 4.2 mmol/L (ref 3.5–5.1)
Sodium: 137 mmol/L (ref 135–145)
Total Bilirubin: 1.2 mg/dL (ref 0.3–1.2)
Total Protein: 7.3 g/dL (ref 6.5–8.1)

## 2020-08-04 LAB — URINALYSIS, MICROSCOPIC (REFLEX)

## 2020-08-04 LAB — RAPID URINE DRUG SCREEN, HOSP PERFORMED
Amphetamines: NOT DETECTED
Barbiturates: NOT DETECTED
Benzodiazepines: NOT DETECTED
Cocaine: NOT DETECTED
Opiates: NOT DETECTED
Tetrahydrocannabinol: POSITIVE — AB

## 2020-08-04 LAB — I-STAT VENOUS BLOOD GAS, ED
Acid-base deficit: 2 mmol/L (ref 0.0–2.0)
Bicarbonate: 25.1 mmol/L (ref 20.0–28.0)
Calcium, Ion: 1.16 mmol/L (ref 1.15–1.40)
HCT: 49 % (ref 39.0–52.0)
Hemoglobin: 16.7 g/dL (ref 13.0–17.0)
O2 Saturation: 57 %
Patient temperature: 98.6
Potassium: 3.8 mmol/L (ref 3.5–5.1)
Sodium: 136 mmol/L (ref 135–145)
TCO2: 27 mmol/L (ref 22–32)
pCO2, Ven: 49.9 mmHg (ref 44.0–60.0)
pH, Ven: 7.309 (ref 7.250–7.430)
pO2, Ven: 33 mmHg (ref 32.0–45.0)

## 2020-08-04 LAB — CBC WITH DIFFERENTIAL/PLATELET
Abs Immature Granulocytes: 0.02 10*3/uL (ref 0.00–0.07)
Basophils Absolute: 0.1 10*3/uL (ref 0.0–0.1)
Basophils Relative: 1 %
Eosinophils Absolute: 0.3 10*3/uL (ref 0.0–0.5)
Eosinophils Relative: 3 %
HCT: 56.8 % — ABNORMAL HIGH (ref 39.0–52.0)
Hemoglobin: 17.7 g/dL — ABNORMAL HIGH (ref 13.0–17.0)
Immature Granulocytes: 0 %
Lymphocytes Relative: 35 %
Lymphs Abs: 3 10*3/uL (ref 0.7–4.0)
MCH: 23 pg — ABNORMAL LOW (ref 26.0–34.0)
MCHC: 31.2 g/dL (ref 30.0–36.0)
MCV: 73.9 fL — ABNORMAL LOW (ref 80.0–100.0)
Monocytes Absolute: 0.7 10*3/uL (ref 0.1–1.0)
Monocytes Relative: 8 %
Neutro Abs: 4.6 10*3/uL (ref 1.7–7.7)
Neutrophils Relative %: 53 %
Platelets: 299 10*3/uL (ref 150–400)
RBC: 7.69 MIL/uL — ABNORMAL HIGH (ref 4.22–5.81)
RDW: 16.5 % — ABNORMAL HIGH (ref 11.5–15.5)
WBC: 8.7 10*3/uL (ref 4.0–10.5)
nRBC: 0 % (ref 0.0–0.2)

## 2020-08-04 LAB — URINALYSIS, ROUTINE W REFLEX MICROSCOPIC
Bilirubin Urine: NEGATIVE
Glucose, UA: >=500 mg/dL — AB
Hgb urine dipstick: NEGATIVE
Ketones, ur: >80 mg/dL — AB
Leukocytes,Ua: NEGATIVE
Nitrite: NEGATIVE
Protein, ur: NEGATIVE mg/dL
Specific Gravity, Urine: 1.02 (ref 1.005–1.030)
pH: 5.5 (ref 5.0–8.0)

## 2020-08-04 LAB — CBG MONITORING, ED
Glucose-Capillary: 348 mg/dL — ABNORMAL HIGH (ref 70–99)
Glucose-Capillary: 455 mg/dL — ABNORMAL HIGH (ref 70–99)
Glucose-Capillary: 281 mg/dL — ABNORMAL HIGH (ref 70–99)

## 2020-08-04 LAB — TROPONIN I (HIGH SENSITIVITY)
Troponin I (High Sensitivity): 2 ng/L (ref <18)
Troponin I (High Sensitivity): 3 ng/L (ref <18)

## 2020-08-04 MED ORDER — INSULIN ASPART 100 UNIT/ML ~~LOC~~ SOLN
8.0000 [IU] | Freq: Once | SUBCUTANEOUS | Status: AC
  Administered 2020-08-04: 8 [IU] via SUBCUTANEOUS
  Filled 2020-08-04: qty 8

## 2020-08-04 MED ORDER — SODIUM CHLORIDE 0.9 % IV BOLUS
1000.0000 mL | Freq: Once | INTRAVENOUS | Status: AC
  Administered 2020-08-04: 1000 mL via INTRAVENOUS

## 2020-08-04 MED ORDER — LISINOPRIL 10 MG PO TABS
20.0000 mg | ORAL_TABLET | Freq: Once | ORAL | Status: DC
  Filled 2020-08-04: qty 2

## 2020-08-04 NOTE — Discharge Instructions (Addendum)
Your labwork and imaging were reassuring today. Please follow up with your PCP if the tingling in your fingertips persist.   Return to the ED IMMEDIATELY for any worsening symptoms.

## 2020-08-04 NOTE — ED Notes (Signed)
Pt ambulatory with steady gait to restroom per pt request

## 2020-08-04 NOTE — ED Provider Notes (Signed)
[facility] EMERGENCY DEPARTMENT Provider Note   CSN: [id number] Arrival date & time: 08/04/20  0831     History Chief Complaint  Patient presents with  . Numbness    Francisco Davila is a 29 y.o. male with PMHx asthma, diabetes, HTN who presents to the ED today with complaint of "numbness." Pt reports that yesterday afternoon he was eating pasta when he began having left sided chest pain; this lasted a couple of minutes before dissipating. Pt states that throughout the day he had intermittent pains but did not think much of it; did not take anything for the pain. He states he got woken up this morning with chest pain on the left side again; he also noticed decreased sensation/tingling in his 3rd-5th digits on the left side. Pt states the sensation goes all the way up to his mid forearm on the ulnar aspect. He states he came to the ED for further evaluation; the chest pain dissipated on arrival and he has not had any pain since then. Pt states that the tingling sensation continues at this time. He has no other complaints. Pt smokes black and milds. He states he is normally a very active person; reports he lost a significant amount of weight with exercise recently and has never had chest pains with it. He is unsure regarding FHx. Pt denies fevers, chills, diaphoresis, nausea, vomiting, shortness of breath, leg swelling, headache, vision changes, confusion, speech changes, unilateral weakness, or any other associated symptoms.   The history is provided by the patient and medical records.       Past Medical History:  Diagnosis Date  . Asthma   . Diabetes mellitus without complication (HCC)   . Hypertension     There are no problems to display for this patient.   History reviewed. No pertinent surgical history.     No family history on file.  Social History   Tobacco Use  . Smoking status: Current Every Day Smoker    Types: Cigars  . Smokeless tobacco: Never Used    Vaping Use  . Vaping Use: Never used  Substance Use Topics  . Alcohol use: Yes    Comment: occ  . Drug use: Yes    Types: Marijuana    Home Medications Prior to Admission medications   Medication Sig Start Date End Date Taking? Authorizing Provider  Insulin NPH Isophane & Regular (HUMULIN 70/30 [place]) Inject into the skin.   Yes [provider]  lisinopril (PRINIVIL,ZESTRIL) 20 MG tablet Take 20 mg by mouth daily.   Yes [provider]  metFORMIN (GLUCOPHAGE) 1000 MG tablet Take by mouth. 10/30/16  Yes [provider]  pantoprazole (PROTONIX) 40 MG tablet Take 1 tablet (40 mg total) by mouth daily. 12/25/18  Yes Pricilla Loveless, MD  simvastatin (ZOCOR) 40 MG tablet Take 40 mg by mouth daily.   Yes [provider]  albuterol (VENTOLIN HFA) 108 (90 Base) MCG/ACT inhaler Inhale 1-2 puffs into the lungs every 6 (six) hours as needed for wheezing or shortness of breath. 06/11/20   Carroll Sage, PA-C  diphenhydrAMINE (BENADRYL) 25 MG tablet Take 1 tablet (25 mg total) by mouth every 6 (six) hours as needed for itching or allergies. 01/21/18   Fayrene Helper, PA-C  metFORMIN (GLUCOPHAGE) 500 MG tablet Take 500 mg by mouth 2 (two) times daily with a meal.    [provider]  ondansetron (ZOFRAN ODT) 4 MG disintegrating tablet Take 1 tablet (4 mg total) by  mouth every 8 (eight) hours as needed. 12/25/18   Pricilla Loveless, MD  ondansetron (ZOFRAN) 4 MG tablet Take 1 tablet (4 mg total) by mouth every 8 (eight) hours as needed for nausea or vomiting. 01/21/18   Fayrene Helper, PA-C    Allergies    Patient has no known allergies.  Review of Systems   Review of Systems  Constitutional: Negative for chills and fever.  Eyes: Negative for visual disturbance.  Respiratory: Negative for cough and shortness of breath.   Cardiovascular: Positive for chest pain. Negative for palpitations and leg swelling.  Gastrointestinal: Negative for abdominal pain, nausea and  vomiting.  Musculoskeletal: Negative for arthralgias.  Neurological: Negative for dizziness, syncope, facial asymmetry, weakness, numbness and headaches.       + paresthesias  All other systems reviewed and are negative.   Physical Exam Updated Vital Signs BP (!) 149/106 (BP Location: Right Arm)   Pulse 78   Temp 98.6 F (37 C) (Oral)   Resp 16   Ht 6' (1.829 m)   Wt 79.4 kg   SpO2 100%   BMI 23.73 kg/m   Physical Exam Vitals and nursing note reviewed.  Constitutional:      Appearance: He is not ill-appearing or diaphoretic.  HENT:     Head: Normocephalic and atraumatic.  Eyes:     Extraocular Movements: Extraocular movements intact.     Conjunctiva/sclera: Conjunctivae normal.     Pupils: Pupils are equal, round, and reactive to light.  Cardiovascular:     Rate and Rhythm: Normal rate and regular rhythm.     Pulses: Normal pulses.  Pulmonary:     Effort: Pulmonary effort is normal.     Breath sounds: Normal breath sounds. No wheezing, rhonchi or rales.  Chest:     Chest wall: No tenderness.  Abdominal:     Palpations: Abdomen is soft.     Tenderness: There is no abdominal tenderness. There is no guarding or rebound.  Musculoskeletal:     Cervical back: Neck supple.     Comments: Negative Tinel's and Phalen's test to suggest carpal tunnel syndrome.  No C, T, or L midline spinal TTP. Negative spurling's test.   Skin:    General: Skin is warm and dry.  Neurological:     Mental Status: He is alert.     Comments: CN 3-12 grossly intact A&O x4 GCS 15 Sensation and strength intact -  Grip strength 4/5 on left side compared to 5/5 on right side however 5/5 with flexion and extension of BUEs. Pt able to discern sharp and dull sensation throughout BUE and BLEs; no difference to the area that pt is complaining of being "numb."  Gait nonataxic including with tandem walking Coordination with finger-to-nose WNL Neg romberg, neg pronator drift     ED Results / Procedures  / Treatments   Labs (all labs ordered are listed, but only abnormal results are displayed) Labs Reviewed  URINALYSIS, ROUTINE W REFLEX MICROSCOPIC - Abnormal; Notable for the following components:      Result Value   Glucose, UA >=500 (*)    Ketones, ur >80 (*)    All other components within normal limits  RAPID URINE DRUG SCREEN, HOSP PERFORMED - Abnormal; Notable for the following components:   Tetrahydrocannabinol POSITIVE (*)    All other components within normal limits  URINALYSIS, MICROSCOPIC (REFLEX) - Abnormal; Notable for the following components:   Bacteria, UA FEW (*)    All other components within normal limits  CBC WITH DIFFERENTIAL/PLATELET - Abnormal; Notable for the following components:   RBC 7.69 (*)    Hemoglobin 17.7 (*)    HCT 56.8 (*)    MCV 73.9 (*)    MCH 23.0 (*)    RDW 16.5 (*)    All other components within normal limits  COMPREHENSIVE METABOLIC PANEL - Abnormal; Notable for the following components:   Glucose, Bld 311 (*)    All other components within normal limits  CBG MONITORING, ED - Abnormal; Notable for the following components:   Glucose-Capillary 348 (*)    All other components within normal limits  CBG MONITORING, ED - Abnormal; Notable for the following components:   Glucose-Capillary 455 (*)    All other components within normal limits  CBG MONITORING, ED - Abnormal; Notable for the following components:   Glucose-Capillary 281 (*)    All other components within normal limits  I-STAT VENOUS BLOOD GAS, ED  CBG MONITORING, ED  TROPONIN I (HIGH SENSITIVITY)  TROPONIN I (HIGH SENSITIVITY)    EKG EKG Interpretation  Date/Time:  Saturday August 04 2020 08:42:00 EDT Ventricular Rate:  94 PR Interval:  132 QRS Duration: 96 QT Interval:  336 QTC Calculation: 420 R Axis:   80 Text Interpretation: Normal sinus rhythm with sinus arrhythmia Abnormal ECG When compared to prior, similar apperance. No sTEMI Confirmed by Theda Belfastegeler, Chris  [phone]([id number]) on 08/04/2020 3:03:45 PM   Radiology DG Chest Portable 1 View  Result Date: 08/04/2020 CLINICAL DATA:  Chest pain. EXAM: PORTABLE CHEST 1 VIEW COMPARISON:  04/11/2020 FINDINGS: Midline trachea.  Normal heart size and mediastinal contours. Sharp costophrenic angles.  No pneumothorax.  Clear lungs. IMPRESSION: Normal chest Electronically Signed   By: Jeronimo GreavesKyle  Talbot M.D.   On: 08/04/2020 11:39    Procedures Procedures (including critical care time)  Medications Ordered in ED Medications  lisinopril (ZESTRIL) tablet 20 mg (20 mg Oral Not Given 08/04/20 1606)  sodium chloride 0.9 % bolus 1,000 mL (0 mLs Intravenous Stopped 08/04/20 1707)  insulin aspart (novoLOG) injection 8 Units (8 Units Subcutaneous Given 08/04/20 1547)    ED Course  I have reviewed the triage vital signs and the nursing notes.  Pertinent labs & imaging results that were available during my care of the patient were reviewed by me and considered in my medical decision making (see chart for details).    MDM Rules/Calculators/A&P                          29 year old male with a past medical history of high blood pressure and diabetes who presents to the ED today complaining of left-sided chest pain as well as paresthesias to his third through fifth digits on the left side that woke him up out of his sleep around 6:30 AM this morning.  On arrival to the ED approximately 1 hour later patient did not have any more chest pain.  No intervention prior to arrival.  Patient states he continues to have this tingling sensation in his third through fifth digits as well as on the ulnar aspect bleeding up to the mid forearm.    EKG was obtained while patient was in the waiting room, no acute changes.  Initial troponin of 2.  Lab work does show a glucose of 311; no gap.  Bicarb 24.  No concern for DKA at this time.  Patient states he did not take his insulin this morning nor did he take his blood pressure medication.  He was found eating  chips while walking back to the room, suspect hyperglycemia related to this.  Will provide insulin at this time.  His CBC with a hemoconcentrated hemoglobin 17.7, hematocrit 56.8.  No leukocytosis.  Will provide fluids at this time.  Urinalysis with greater than 80 ketones.  UDS positive for THC.  Chest x-ray without any acute findings.    On my exam patient has no focal neuro deficits.  He continues to complain of a decreased sensation to his third through fifth digits on the left side however he is able to easily discern dull and sharp sensation.  He has 4 out of 5 grip strength on the left side compared to 5 out of 5 on the right however 5 out of 5 strength bilaterally throughout other ranges of motion on his left arm.  He has no leg involvement.  There is no obvious signs of facial droop, remainder of cranial nerves is normal.  Very much doubt stroke at this time.  Patient is PERC negative.  Do not feel he needs D-dimer or CT angio of his chest. He has equal pulses throughout and appears well; doubt dissection. I have very low suspicion for ACS as well given patient's young age.  He states he is actually very active has lost a lot of weight recently with trying.  He has no chest pain with activity.  He is unsure of family history however he is a smoker.  We will plan for repeat troponin at this time.  We will plan for fluids given signs of dehydration, 8 units insulin, provide patient with his 20 mg lisinopril and reevaluate.  It does not appear that the decrease sensation is related to patient's chest pain at this time, I will have him follow-up with his PCP regarding this. Question neuropathy s/2 to diabetes. He is phalen's and tinel's negative to suggest carpal tunnell at this time.  Discussed this with attending physician Dr. Rush Landmark who agrees with plan.   Repeat CBG prior to giving insulin 488; pt has been eating in the WR. Given this will obtain VBG to rule out DKA.   VBG with normal pH 7.309;  bicarb 25.1. No gap. Will repeat CBG after fluids and insulin.   Repeat CBG 281.   Repeat troponin of 3; this was collected 7 hours after the first one. Feel pt is stable from a cardiac stand point. Will discharge home at this time with close PCP follow up. Pt is in agreement with plan and stable for discharge home.   This note was prepared using Dragon voice recognition software and may include unintentional dictation errors due to the inherent limitations of voice recognition software.  Final Clinical Impression(s) / ED Diagnoses Final diagnoses:  Paresthesia  Chest pain, unspecified type    Rx / DC Orders ED Discharge Orders    None       Discharge Instructions     Your labwork and imaging were reassuring today. Please follow up with your PCP if the tingling in your fingertips persist.   Return to the ED IMMEDIATELY for any worsening symptoms.        Tanda Rockers, PA-C 08/04/20 1800    Tegeler, Canary Brim, MD 08/04/20 [phone]

## 2020-08-04 NOTE — ED Notes (Signed)
Updated EDP regarding pts CBG and b/p. Instructed to hold b/p meds and will recheck CBG at 1700

## 2020-08-04 NOTE — ED Notes (Signed)
Pt ambulatory to room, carrying drink and chips. No s/s of distress

## 2020-08-04 NOTE — ED Notes (Signed)
cbg 455

## 2020-08-04 NOTE — ED Notes (Signed)
ED Provider at bedside. 

## 2020-08-04 NOTE — ED Triage Notes (Addendum)
Chest pain at times since yesterday and numbness to left hand . Denies chest pain currently but having numbness to left hand . States, ' My body feels weird"

## 2020-08-04 NOTE — ED Notes (Signed)
CBG- 281 

## 2020-08-04 NOTE — ED Notes (Signed)
Pt discharged to home. Discharge instructions have been discussed with patient and/or family members. Pt verbally acknowledges understanding d/c instructions, and endorses comprehension to checkout at registration before leaving.  °

## 2020-10-21 IMAGING — DX DG SHOULDER 2+V*R*
3 series · 3 of 3 positions shown · non-contrast
Comparison: Chest radiograph dated 04/11/2020.

CLINICAL DATA: 29-year-old male with right shoulder pain.

EXAM:
RIGHT SHOULDER - 2+ VIEW

[shoulder grashey]
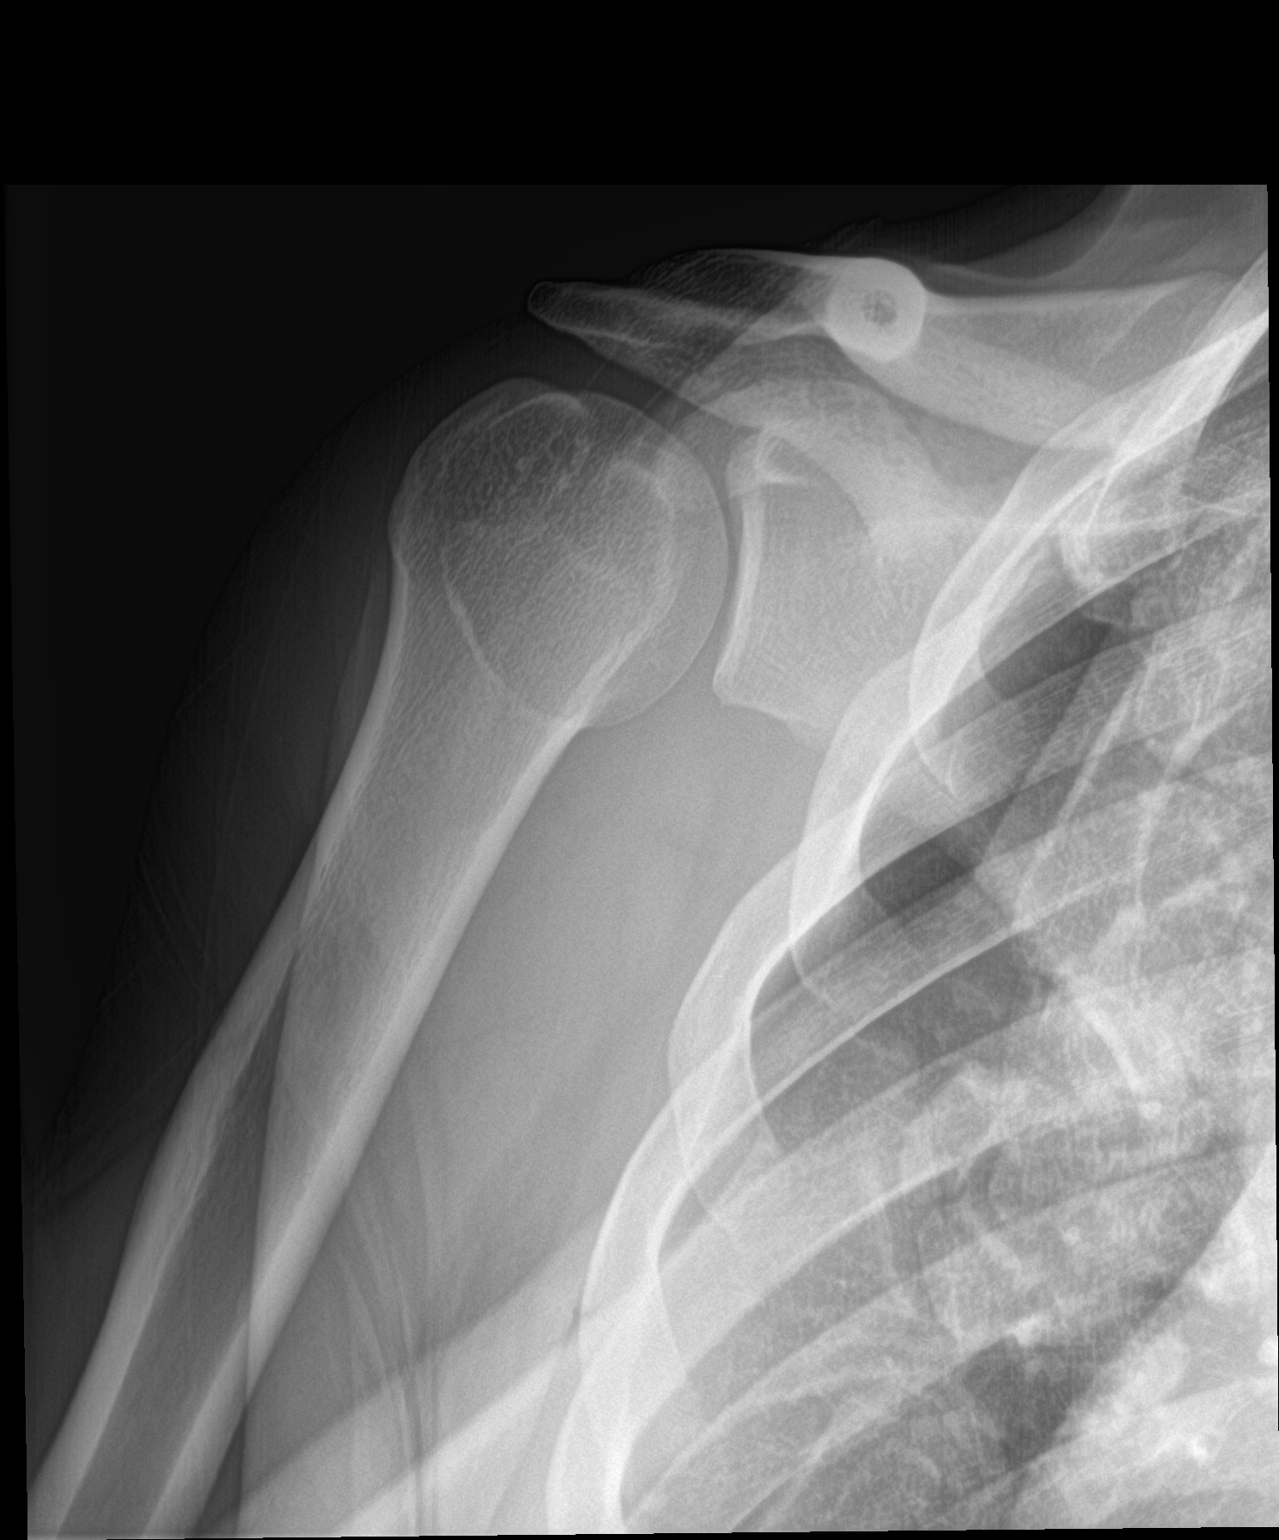

[shoulder y view]
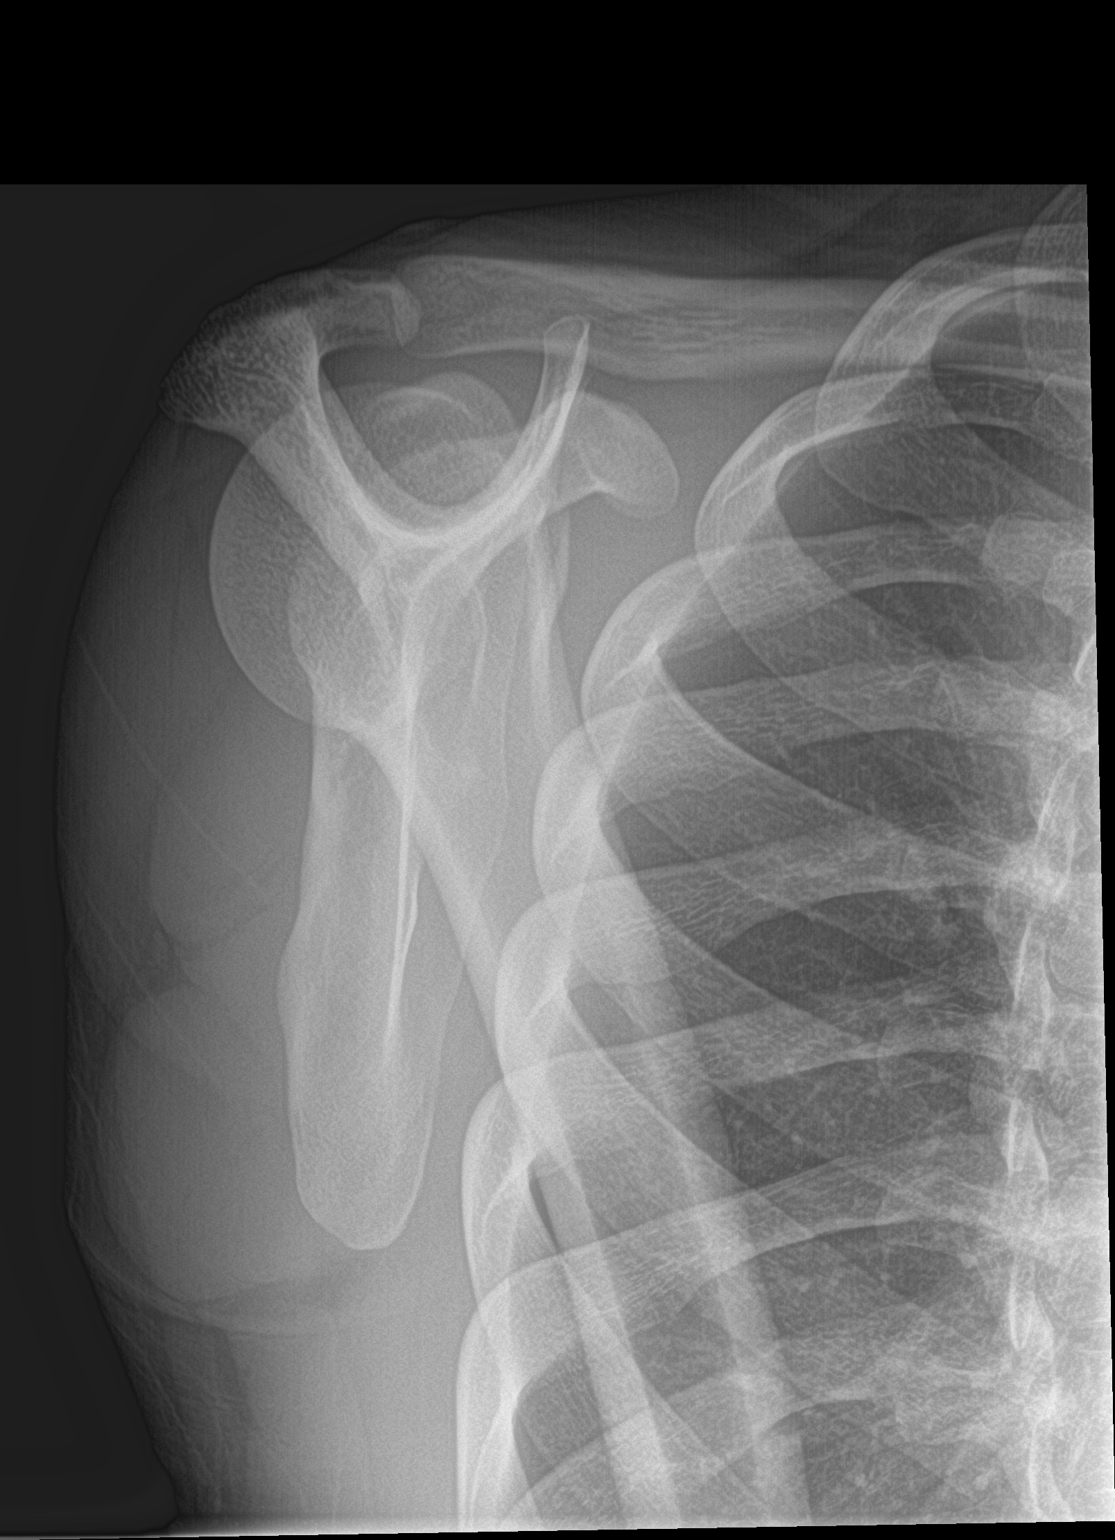

[shoulder axillary]
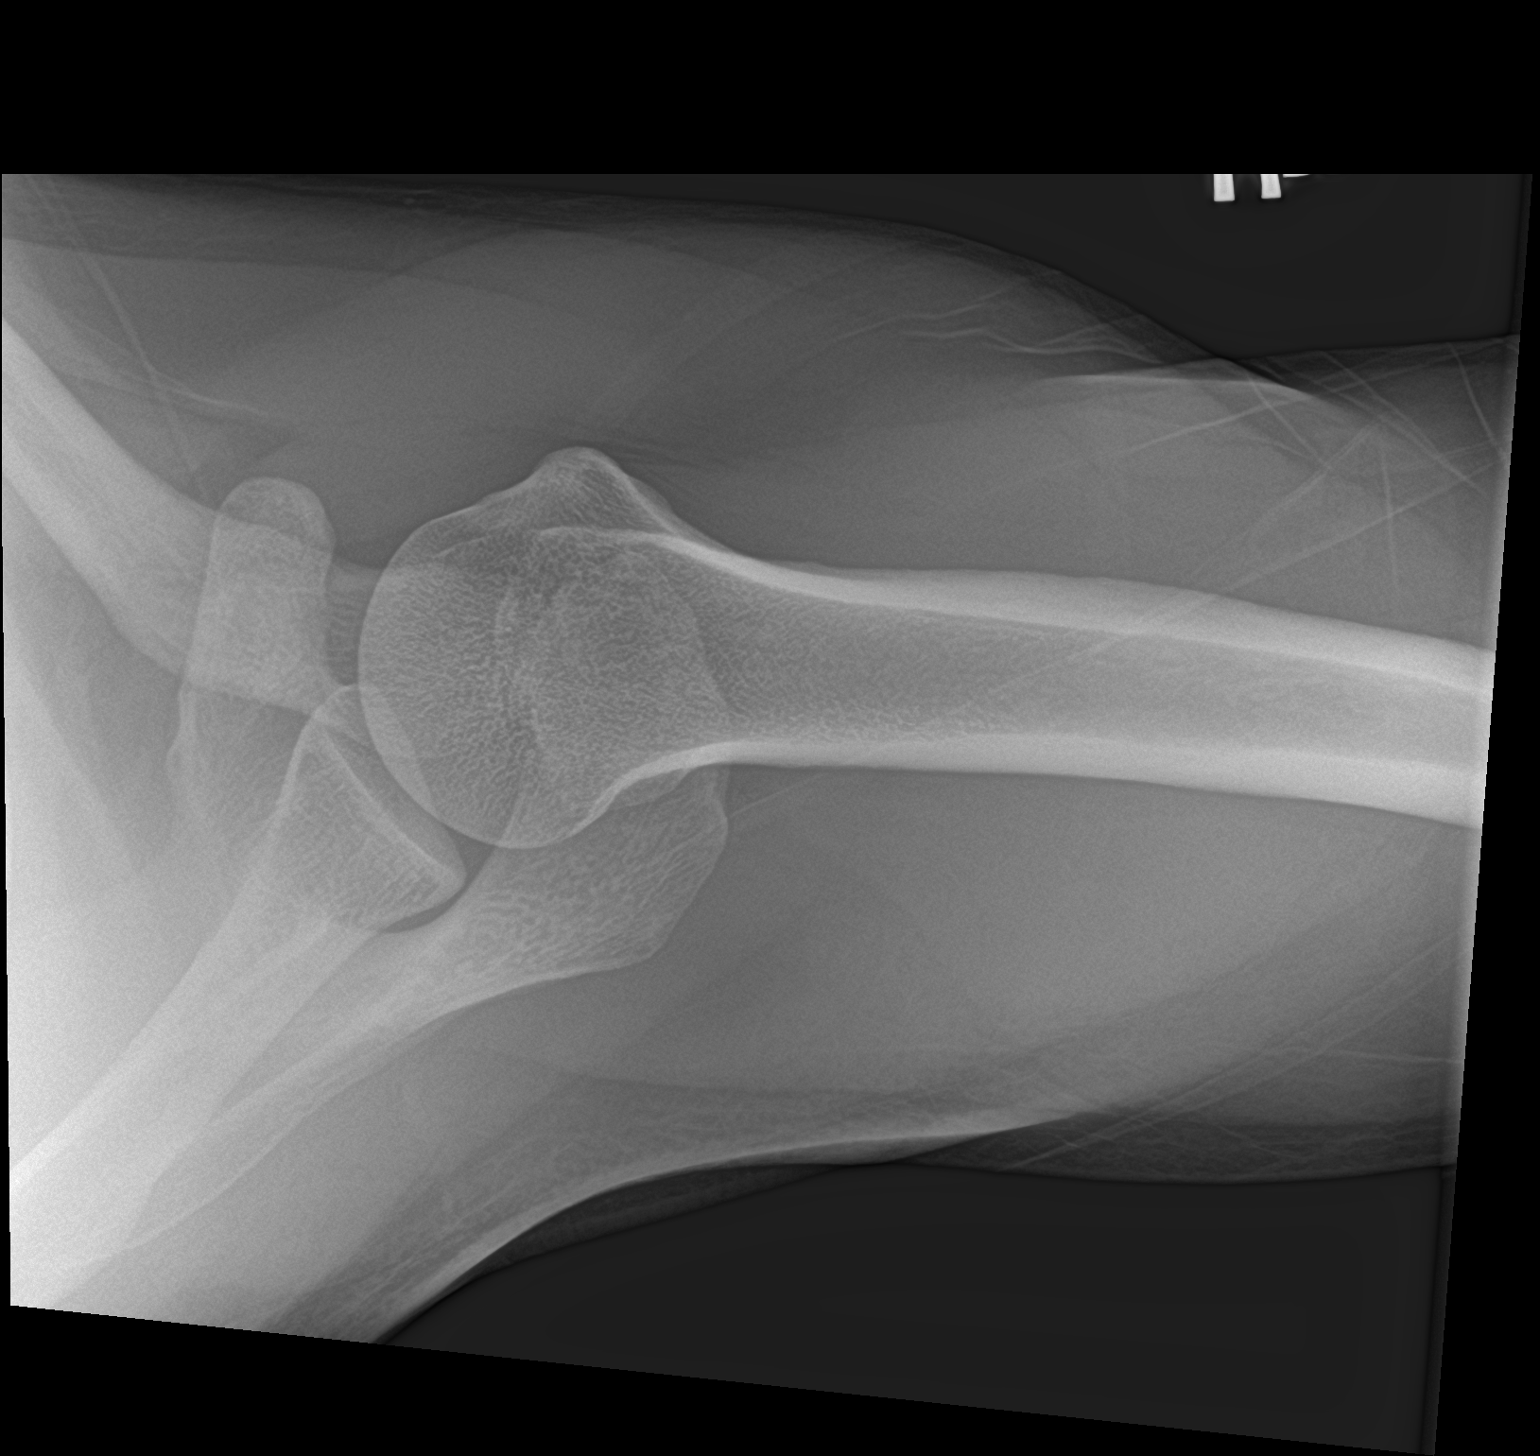

[3 of 3 positions shown; findings below may reference images not displayed]

FINDINGS: There is no evidence of fracture or dislocation. There is no
evidence of arthropathy or other focal bone abnormality. Soft
tissues are unremarkable.
IMPRESSION: Negative.

## 2021-04-15 ENCOUNTER — Inpatient Hospital Stay (HOSPITAL_BASED_OUTPATIENT_CLINIC_OR_DEPARTMENT_OTHER)
Admission: EM | Admit: 2021-04-15 | Discharge: 2021-04-17 | DRG: 638 | Disposition: A | Discharge status: Home / Self Care | Payer: Medicaid Other | Attending: Family Medicine | Admitting: Family Medicine

## 2021-04-15 ENCOUNTER — Other Ambulatory Visit: Payer: Self-pay

## 2021-04-15 ENCOUNTER — Encounter (HOSPITAL_BASED_OUTPATIENT_CLINIC_OR_DEPARTMENT_OTHER): Payer: Self-pay | Admitting: *Deleted

## 2021-04-15 DIAGNOSIS — Z794 Long term (current) use of insulin: Secondary | ICD-10-CM

## 2021-04-15 DIAGNOSIS — R17 Unspecified jaundice: Secondary | ICD-10-CM | POA: Diagnosis present

## 2021-04-15 DIAGNOSIS — N179 Acute kidney failure, unspecified: Secondary | ICD-10-CM | POA: Diagnosis present

## 2021-04-15 DIAGNOSIS — E875 Hyperkalemia: Secondary | ICD-10-CM | POA: Diagnosis present

## 2021-04-15 DIAGNOSIS — Z20822 Contact with and (suspected) exposure to covid-19: Secondary | ICD-10-CM | POA: Diagnosis present

## 2021-04-15 DIAGNOSIS — I10 Essential (primary) hypertension: Secondary | ICD-10-CM | POA: Diagnosis present

## 2021-04-15 DIAGNOSIS — Z7984 Long term (current) use of oral hypoglycemic drugs: Secondary | ICD-10-CM

## 2021-04-15 DIAGNOSIS — E781 Pure hyperglyceridemia: Secondary | ICD-10-CM | POA: Diagnosis present

## 2021-04-15 DIAGNOSIS — E1110 Type 2 diabetes mellitus with ketoacidosis without coma: Secondary | ICD-10-CM | POA: Diagnosis present

## 2021-04-15 DIAGNOSIS — F17290 Nicotine dependence, other tobacco product, uncomplicated: Secondary | ICD-10-CM | POA: Diagnosis present

## 2021-04-15 DIAGNOSIS — J45909 Unspecified asthma, uncomplicated: Secondary | ICD-10-CM | POA: Diagnosis present

## 2021-04-15 DIAGNOSIS — Z79899 Other long term (current) drug therapy: Secondary | ICD-10-CM

## 2021-04-15 LAB — BILIRUBIN, FRACTIONATED(TOT/DIR/INDIR)
Bilirubin, Direct: <0.1 mg/dL (ref 0.0–0.2)
Total Bilirubin: 0.8 mg/dL (ref 0.3–1.2)

## 2021-04-15 LAB — I-STAT VENOUS BLOOD GAS, ED
Acid-base deficit: 20 mmol/L — ABNORMAL HIGH (ref 0.0–2.0)
Acid-base deficit: 18 mmol/L — ABNORMAL HIGH (ref 0.0–2.0)
Bicarbonate: 7.7 mmol/L — ABNORMAL LOW (ref 20.0–28.0)
Bicarbonate: 11 mmol/L — ABNORMAL LOW (ref 20.0–28.0)
Calcium, Ion: 1.18 mmol/L (ref 1.15–1.40)
Calcium, Ion: 1.23 mmol/L (ref 1.15–1.40)
HCT: 57 % — ABNORMAL HIGH (ref 39.0–52.0)
HCT: 50 % (ref 39.0–52.0)
Hemoglobin: 19.4 g/dL — ABNORMAL HIGH (ref 13.0–17.0)
Hemoglobin: 17 g/dL (ref 13.0–17.0)
O2 Saturation: 92 %
O2 Saturation: 27 %
Potassium: 6.1 mmol/L — ABNORMAL HIGH (ref 3.5–5.1)
Potassium: 6.7 mmol/L (ref 3.5–5.1)
Sodium: 135 mmol/L (ref 135–145)
Sodium: 139 mmol/L (ref 135–145)
TCO2: 8 mmol/L — ABNORMAL LOW (ref 22–32)
TCO2: 12 mmol/L — ABNORMAL LOW (ref 22–32)
pCO2, Ven: 24.1 mmHg — ABNORMAL LOW (ref 44.0–60.0)
pCO2, Ven: 36.7 mmHg — ABNORMAL LOW (ref 44.0–60.0)
pH, Ven: 7.115 — CL (ref 7.250–7.430)
pH, Ven: 7.083 — CL (ref 7.250–7.430)
pO2, Ven: 83 mmHg — ABNORMAL HIGH (ref 32.0–45.0)
pO2, Ven: 25 mmHg — CL (ref 32.0–45.0)

## 2021-04-15 LAB — CBC WITH DIFFERENTIAL/PLATELET
Abs Immature Granulocytes: 0.02 10*3/uL (ref 0.00–0.07)
Basophils Absolute: 0 10*3/uL (ref 0.0–0.1)
Basophils Relative: 1 %
Eosinophils Absolute: 0 10*3/uL (ref 0.0–0.5)
Eosinophils Relative: 0 %
HCT: 52.7 % — ABNORMAL HIGH (ref 39.0–52.0)
Hemoglobin: 17.4 g/dL — ABNORMAL HIGH (ref 13.0–17.0)
Immature Granulocytes: 0 %
Lymphocytes Relative: 20 %
Lymphs Abs: 1.6 10*3/uL (ref 0.7–4.0)
MCH: 25.6 pg — ABNORMAL LOW (ref 26.0–34.0)
MCHC: 33 g/dL (ref 30.0–36.0)
MCV: 77.4 fL — ABNORMAL LOW (ref 80.0–100.0)
Monocytes Absolute: 0.5 10*3/uL (ref 0.1–1.0)
Monocytes Relative: 7 %
Neutro Abs: 5.7 10*3/uL (ref 1.7–7.7)
Neutrophils Relative %: 72 %
Platelets: 388 10*3/uL (ref 150–400)
RBC: 6.81 MIL/uL — ABNORMAL HIGH (ref 4.22–5.81)
RDW: 17.1 % — ABNORMAL HIGH (ref 11.5–15.5)
WBC: 7.8 10*3/uL (ref 4.0–10.5)
nRBC: 0 % (ref 0.0–0.2)

## 2021-04-15 LAB — I-STAT CHEM 8, ED
BUN: 38 mg/dL — ABNORMAL HIGH (ref 6–20)
Calcium, Ion: 1.27 mmol/L (ref 1.15–1.40)
Chloride: 122 mmol/L — ABNORMAL HIGH (ref 98–111)
Creatinine, Ser: 1.1 mg/dL (ref 0.61–1.24)
Glucose, Bld: 365 mg/dL — ABNORMAL HIGH (ref 70–99)
HCT: 51 % (ref 39.0–52.0)
Hemoglobin: 17.3 g/dL — ABNORMAL HIGH (ref 13.0–17.0)
Potassium: 6.5 mmol/L (ref 3.5–5.1)
Sodium: 141 mmol/L (ref 135–145)
TCO2: 14 mmol/L — ABNORMAL LOW (ref 22–32)

## 2021-04-15 LAB — LIPASE, BLOOD: Lipase: 24 U/L (ref 11–51)

## 2021-04-15 LAB — GLUCOSE, CAPILLARY
Glucose-Capillary: 267 mg/dL — ABNORMAL HIGH (ref 70–99)
Glucose-Capillary: 216 mg/dL — ABNORMAL HIGH (ref 70–99)
Glucose-Capillary: 283 mg/dL — ABNORMAL HIGH (ref 70–99)
Glucose-Capillary: 224 mg/dL — ABNORMAL HIGH (ref 70–99)
Glucose-Capillary: 190 mg/dL — ABNORMAL HIGH (ref 70–99)
Glucose-Capillary: 148 mg/dL — ABNORMAL HIGH (ref 70–99)

## 2021-04-15 LAB — URINALYSIS, ROUTINE W REFLEX MICROSCOPIC
Bilirubin Urine: NEGATIVE
Glucose, UA: >=500 mg/dL — AB
Ketones, ur: >80 mg/dL — AB
Leukocytes,Ua: NEGATIVE
Nitrite: NEGATIVE
Protein, ur: 100 mg/dL — AB
Specific Gravity, Urine: >1.03 — ABNORMAL HIGH (ref 1.005–1.030)
pH: 5.5 (ref 5.0–8.0)

## 2021-04-15 LAB — BASIC METABOLIC PANEL
Anion gap: 10 (ref 5–15)
BUN: 20 mg/dL (ref 6–20)
CO2: 16 mmol/L — ABNORMAL LOW (ref 22–32)
Calcium: 8.5 mg/dL — ABNORMAL LOW (ref 8.9–10.3)
Chloride: 114 mmol/L — ABNORMAL HIGH (ref 98–111)
Creatinine, Ser: 0.97 mg/dL (ref 0.61–1.24)
GFR, Estimated: >60 mL/min (ref >60)
Glucose, Bld: 207 mg/dL — ABNORMAL HIGH (ref 70–99)
Potassium: 4 mmol/L (ref 3.5–5.1)
Sodium: 140 mmol/L (ref 135–145)

## 2021-04-15 LAB — BLOOD GAS, VENOUS
Acid-base deficit: 9.7 mmol/L — ABNORMAL HIGH (ref 0.0–2.0)
Bicarbonate: 16.9 mmol/L — ABNORMAL LOW (ref 20.0–28.0)
O2 Saturation: 78.5 %
Patient temperature: 98.6
pCO2, Ven: 40.6 mmHg — ABNORMAL LOW (ref 44.0–60.0)
pH, Ven: 7.244 — ABNORMAL LOW (ref 7.250–7.430)
pO2, Ven: 44.4 mmHg (ref 32.0–45.0)

## 2021-04-15 LAB — CBG MONITORING, ED
Glucose-Capillary: 542 mg/dL (ref 70–99)
Glucose-Capillary: 402 mg/dL — ABNORMAL HIGH (ref 70–99)
Glucose-Capillary: 460 mg/dL — ABNORMAL HIGH (ref 70–99)
Glucose-Capillary: 454 mg/dL — ABNORMAL HIGH (ref 70–99)
Glucose-Capillary: 349 mg/dL — ABNORMAL HIGH (ref 70–99)

## 2021-04-15 LAB — RESP PANEL BY RT-PCR (FLU A&B, COVID) ARPGX2
Influenza A by PCR: NEGATIVE
Influenza B by PCR: NEGATIVE
SARS Coronavirus 2 by RT PCR: NEGATIVE

## 2021-04-15 LAB — BETA-HYDROXYBUTYRIC ACID
Beta-Hydroxybutyric Acid: 3.89 mmol/L — ABNORMAL HIGH (ref 0.05–0.27)
Beta-Hydroxybutyric Acid: 2.88 mmol/L — ABNORMAL HIGH (ref 0.05–0.27)

## 2021-04-15 LAB — MAGNESIUM: Magnesium: 3.2 mg/dL — ABNORMAL HIGH (ref 1.7–2.4)

## 2021-04-15 LAB — URINALYSIS, MICROSCOPIC (REFLEX)

## 2021-04-15 LAB — LIPID PANEL
Cholesterol: 748 mg/dL — ABNORMAL HIGH (ref 0–200)
LDL Cholesterol: UNDETERMINED mg/dL (ref 0–99)
Triglycerides: >5000 mg/dL — ABNORMAL HIGH (ref <150)
VLDL: UNDETERMINED mg/dL (ref 0–40)

## 2021-04-15 LAB — LDL CHOLESTEROL, DIRECT: Direct LDL: <10 mg/dL (ref 0–99)

## 2021-04-15 LAB — FOLATE: Folate: 23.2 ng/mL (ref >5.9)

## 2021-04-15 LAB — VITAMIN B12: Vitamin B-12: 1232 pg/mL — ABNORMAL HIGH (ref 180–914)

## 2021-04-15 LAB — MRSA PCR SCREENING: MRSA by PCR: NEGATIVE

## 2021-04-15 MED ORDER — FLUTICASONE FUROATE-VILANTEROL 200-25 MCG/INH IN AEPB
1.0000 | INHALATION_SPRAY | Freq: Every day | RESPIRATORY_TRACT | Status: DC
  Administered 2021-04-16 – 2021-04-17 (×2): 1 via RESPIRATORY_TRACT
  Filled 2021-04-15: qty 28

## 2021-04-15 MED ORDER — LORAZEPAM 2 MG/ML IJ SOLN: 1.0000 mg | INTRAMUSCULAR | Status: DC | PRN

## 2021-04-15 MED ORDER — INSULIN REGULAR(HUMAN) IN NACL 100-0.9 UT/100ML-% IV SOLN
INTRAVENOUS | Status: DC
Start: 2021-04-15 — End: 2021-04-16
  Administered 2021-04-15: 12 [IU]/h via INTRAVENOUS
  Filled 2021-04-15 (×2): qty 100

## 2021-04-15 MED ORDER — LACTATED RINGERS IV SOLN: INTRAVENOUS | Status: DC

## 2021-04-15 MED ORDER — DEXTROSE IN LACTATED RINGERS 5 % IV SOLN: INTRAVENOUS | Status: DC

## 2021-04-15 MED ORDER — THIAMINE HCL 100 MG PO TABS
100.0000 mg | ORAL_TABLET | Freq: Every day | ORAL | Status: DC
  Administered 2021-04-16 – 2021-04-17 (×2): 100 mg via ORAL
  Filled 2021-04-15 (×2): qty 1

## 2021-04-15 MED ORDER — DEXTROSE 50 % IV SOLN: 0.0000 mL | INTRAVENOUS | Status: DC | PRN

## 2021-04-15 MED ORDER — SODIUM CHLORIDE 0.9 % IV SOLN: INTRAVENOUS | Status: DC

## 2021-04-15 MED ORDER — SODIUM CHLORIDE 0.9 % IV BOLUS
2000.0000 mL | Freq: Once | INTRAVENOUS | Status: AC
  Administered 2021-04-15: 2000 mL via INTRAVENOUS

## 2021-04-15 MED ORDER — THIAMINE HCL 100 MG/ML IJ SOLN
100.0000 mg | Freq: Every day | INTRAMUSCULAR | Status: DC
  Administered 2021-04-15: 100 mg via INTRAVENOUS

## 2021-04-15 MED ORDER — DEXTROSE-NACL 5-0.45 % IV SOLN: INTRAVENOUS | Status: DC

## 2021-04-15 MED ORDER — DIPHENHYDRAMINE HCL 25 MG PO CAPS: 25.0000 mg | ORAL_CAPSULE | Freq: Four times a day (QID) | ORAL | Status: DC | PRN

## 2021-04-15 MED ORDER — ADULT MULTIVITAMIN W/MINERALS CH
1.0000 | ORAL_TABLET | Freq: Every day | ORAL | Status: DC
  Administered 2021-04-15 – 2021-04-17 (×3): 1 via ORAL
  Filled 2021-04-15 (×2): qty 1

## 2021-04-15 MED ORDER — FOLIC ACID 1 MG PO TABS
1.0000 mg | ORAL_TABLET | Freq: Every day | ORAL | Status: DC
  Administered 2021-04-15 – 2021-04-17 (×3): 1 mg via ORAL
  Filled 2021-04-15 (×3): qty 1

## 2021-04-15 MED ORDER — CHLORHEXIDINE GLUCONATE CLOTH 2 % EX PADS
6.0000 | MEDICATED_PAD | Freq: Every day | CUTANEOUS | Status: DC
  Administered 2021-04-15 – 2021-04-16 (×2): 6 via TOPICAL

## 2021-04-15 MED ORDER — ENOXAPARIN SODIUM 40 MG/0.4ML IJ SOSY
40.0000 mg | PREFILLED_SYRINGE | INTRAMUSCULAR | Status: DC
  Administered 2021-04-16: 40 mg via SUBCUTANEOUS
  Filled 2021-04-15 (×2): qty 0.4

## 2021-04-15 MED ORDER — POTASSIUM CHLORIDE 10 MEQ/100ML IV SOLN: 10.0000 meq | Freq: Once | INTRAVENOUS | Status: DC

## 2021-04-15 MED ORDER — HYDRALAZINE HCL 20 MG/ML IJ SOLN
10.0000 mg | Freq: Three times a day (TID) | INTRAMUSCULAR | Status: DC | PRN
  Administered 2021-04-16: 10 mg via INTRAVENOUS
  Filled 2021-04-15: qty 1

## 2021-04-15 MED ORDER — LORAZEPAM 2 MG/ML IJ SOLN
1.0000 mg | Freq: Once | INTRAMUSCULAR | Status: AC
  Administered 2021-04-15: 1 mg via INTRAVENOUS
  Filled 2021-04-15: qty 1

## 2021-04-15 MED ORDER — ALBUTEROL SULFATE HFA 108 (90 BASE) MCG/ACT IN AERS: 1.0000 | INHALATION_SPRAY | Freq: Four times a day (QID) | RESPIRATORY_TRACT | Status: DC | PRN

## 2021-04-15 MED ORDER — LORAZEPAM 1 MG PO TABS: 1.0000 mg | ORAL_TABLET | ORAL | Status: DC | PRN

## 2021-04-15 NOTE — ED Notes (Signed)
Report given to [facility] 

## 2021-04-15 NOTE — ED Notes (Signed)
PA at bedside and aware of increase in CBG

## 2021-04-15 NOTE — ED Notes (Signed)
ED Provider at bedside. 

## 2021-04-15 NOTE — ED Notes (Signed)
CBG-460 

## 2021-04-15 NOTE — ED Notes (Signed)
Pt reports last ETOH last night unsure of time.

## 2021-04-15 NOTE — ED Provider Notes (Signed)
[facility] EMERGENCY DEPARTMENT Provider Note   CSN: [id number] Arrival date & time: 04/15/21  1004     History Chief Complaint  Patient presents with  . Hyperglycemia    Warren Dingee is a 30 y.o. male.  HPI 30 year old male past medical history significant for type 2 diabetes presents to the emergency department today for evaluation of nausea, fatigue, weakness, elevated blood sugar.  Patient reports that over the past week he was at the beach and drinking significant amount of alcohol.  Patient reports this is what is caused her sugar to be elevated.  Patient reports nausea without vomiting.  He reports being very weak and fatigued.  Denies chest pain or shortness of breath.  No abdominal pain.  He reports complications with diabetes as a child.  Patient is on metformin.  No fevers or chills.    Past Medical History:  Diagnosis Date  . Asthma   . Diabetes mellitus without complication (HCC)   . Hypertension     There are no problems to display for this patient.   History reviewed. No pertinent surgical history.     No family history on file.  Social History   Tobacco Use  . Smoking status: Current Every Day Smoker    Types: Cigars  . Smokeless tobacco: Never Used  Vaping Use  . Vaping Use: Never used  Substance Use Topics  . Alcohol use: Yes    Comment: occ  . Drug use: Yes    Types: Marijuana    Home Medications Prior to Admission medications   Medication Sig Start Date End Date Taking? Authorizing Provider  albuterol (VENTOLIN HFA) 108 (90 Base) MCG/ACT inhaler Inhale 1-2 puffs into the lungs every 6 (six) hours as needed for wheezing or shortness of breath. 06/11/20   Carroll Sage, PA-C  diphenhydrAMINE (BENADRYL) 25 MG tablet Take 1 tablet (25 mg total) by mouth every 6 (six) hours as needed for itching or allergies. 01/21/18   Fayrene Helper, PA-C  Insulin NPH Isophane & Regular (HUMULIN 70/30 [place]) Inject into the skin.    [provider]  lisinopril (PRINIVIL,ZESTRIL) 20 MG tablet Take 20 mg by mouth daily.    [provider]  metFORMIN (GLUCOPHAGE) 1000 MG tablet Take by mouth. 10/30/16   [provider]  metFORMIN (GLUCOPHAGE) 500 MG tablet Take 500 mg by mouth 2 (two) times daily with a meal.    [provider]  ondansetron (ZOFRAN ODT) 4 MG disintegrating tablet Take 1 tablet (4 mg total) by mouth every 8 (eight) hours as needed. 12/25/18   Pricilla Loveless, MD  ondansetron (ZOFRAN) 4 MG tablet Take 1 tablet (4 mg total) by mouth every 8 (eight) hours as needed for nausea or vomiting. 01/21/18   Fayrene Helper, PA-C  pantoprazole (PROTONIX) 40 MG tablet Take 1 tablet (40 mg total) by mouth daily. 12/25/18   Pricilla Loveless, MD  simvastatin (ZOCOR) 40 MG tablet Take 40 mg by mouth daily.    [provider]    Allergies    Patient has no known allergies.  Review of Systems   Review of Systems  Constitutional: Positive for fatigue. Negative for chills and fever.  HENT: Negative for congestion.   Eyes: Negative for discharge.  Respiratory: Negative for cough.   Cardiovascular: Negative for chest pain.  Gastrointestinal: Positive for nausea. Negative for abdominal pain, diarrhea and vomiting.  Genitourinary: Negative for difficulty urinating.  Musculoskeletal: Negative for myalgias.  Skin: Negative for color change.  Neurological: Positive for weakness. Negative for headaches.  Psychiatric/Behavioral: Negative for confusion.    Physical Exam Updated Vital Signs BP (!) 160/107 (BP Location: Right Arm)   Pulse (!) 123   Temp 98.4 F (36.9 C)   Resp 20   Ht 6' (1.829 m)   Wt 71.7 kg   SpO2 98%   BMI 21.43 kg/m   Physical Exam Vitals and nursing note reviewed.  Constitutional:      General: He is not in acute distress.    Appearance: He is well-developed. He is not ill-appearing or toxic-appearing.  HENT:     Head: Normocephalic and atraumatic.  Eyes:      General: No scleral icterus.       Right eye: No discharge.        Left eye: No discharge.  Cardiovascular:     Rate and Rhythm: Regular rhythm. Tachycardia present.     Pulses: Normal pulses.     Heart sounds: Normal heart sounds. No murmur heard. No friction rub. No gallop.   Pulmonary:     Effort: Pulmonary effort is normal. No respiratory distress.     Breath sounds: Normal breath sounds. No stridor. No wheezing, rhonchi or rales.  Chest:     Chest wall: No tenderness.  Abdominal:     General: Abdomen is flat. Bowel sounds are normal. There is no distension.     Palpations: Abdomen is soft. There is no mass.     Tenderness: There is no abdominal tenderness. There is no right CVA tenderness, left CVA tenderness, guarding or rebound.     Hernia: No hernia is present.  Musculoskeletal:        General: Normal range of motion.     Cervical back: Normal range of motion and neck supple.  Skin:    General: Skin is warm and dry.     Capillary Refill: Capillary refill takes less than 2 seconds.     Coloration: Skin is not pale.  Neurological:     Mental Status: He is alert.  Psychiatric:        Mood and Affect: Mood normal.        Behavior: Behavior normal.     ED Results / Procedures / Treatments   Labs (all labs ordered are listed, but only abnormal results are displayed) Labs Reviewed  CBG MONITORING, ED - Abnormal; Notable for the following components:      Result Value   Glucose-Capillary 542 (*)    All other components within normal limits  CBC WITH DIFFERENTIAL/PLATELET  COMPREHENSIVE METABOLIC PANEL  BLOOD GAS, VENOUS  URINALYSIS, ROUTINE W REFLEX MICROSCOPIC  MAGNESIUM  LIPASE, BLOOD    EKG EKG Interpretation  Date/Time:  Monday Apr 15 2021 10:50:52 EDT Ventricular Rate:  111 PR Interval:  105 QRS Duration: 85 QT Interval:  386 QTC Calculation: 525 R Axis:   102 Text Interpretation: Sinus tachycardia Biatrial enlargement Probable lateral infarct, old  Probable anteroseptal infarct, old Prolonged QT interval No Stemi Confirmed by Meridee Score [phone]) on 04/15/2021 10:54:34 AM   Radiology No results found.  Procedures .Critical Care Performed by: Rise Mu, PA-C Authorized by: Rise Mu, PA-C   Critical care provider statement:    Critical care time (minutes):  45   Critical care was necessary to treat or prevent imminent or life-threatening deterioration of the following conditions:  Endocrine crisis   Critical care was time spent personally by me on the following activities:  Development of treatment plan  with patient or surrogate, ordering and review of laboratory studies, discussions with consultants, pulse oximetry, evaluation of patient's response to treatment, review of old charts, re-evaluation of patient's condition, examination of patient, gastric intubation and transcutaneous pacing     Medications Ordered in ED Medications  sodium chloride 0.9 % bolus 2,000 mL (2,000 mLs Intravenous New Bag/Given 04/15/21 1101)  LORazepam (ATIVAN) injection 1 mg (1 mg Intravenous Given 04/15/21 1107)    ED Course  I have reviewed the triage vital signs and the nursing notes.  Pertinent labs & imaging results that were available during my care of the patient were reviewed by me and considered in my medical decision making (see chart for details).    MDM Rules/Calculators/A&P                          30 year old presents for hyperglycemia.  Patient's initial glucose was 542 on arrival.  ABG shows patient is acidotic 7.11.  Patient's blood was lipemic and had to be sent to [facility] for further evaluation.  Patient does have ketones in his urine.  Initial  metabolic panel showed a bicarb of 6.  Potassium was 5.2.  Patient started on insulin drip along with 2.5 L of fluid.  Patient will need admission for further DKA management.  Case discussed with Dr. Ronaldo Miyamoto who accept patient for admission. Pt is hds.  Blood sugar is  improving. Final Clinical Impression(s) / ED Diagnoses Final diagnoses:  Diabetic ketoacidosis without coma associated with type 2 diabetes mellitus [facility])    Rx / DC Orders ED Discharge Orders    None       Wallace Keller 04/15/21 1756    Terrilee Files, MD 04/15/21 [phone]

## 2021-04-15 NOTE — ED Triage Notes (Signed)
C/o n/v weakness and high blood sugar,  Has been on vacation with a lot etoh and other things

## 2021-04-15 NOTE — ED Notes (Addendum)
Patient known lipemic, unable to r/o hemolysis on I-STAT sample, ER provider aware.

## 2021-04-15 NOTE — Progress Notes (Signed)
.  Notified by EDP of need for admission d/t DKA. [facility] accepts patient to stepdown at [facility]. EDP is to remain responsible for orders/medical decisions while patient is holding at [facility]. Upon arrival to [facility], [facility] will assume care. Nursing staff will call flow manager/carelink to notify them of patient's arrival so that the proper [facility] member may receive the patient. Thank you.

## 2021-04-15 NOTE — H&P (Signed)
History and Physical    Francisco Davila YQM:[id number] DOB: 31-May-1991 DOA: 04/15/2021  PCP: [facility]  Patient coming from: Home  Chief Complaint: Nausea  HPI: Francisco Davila is a 30 y.o. male with medical history significant of HTN, DM2, asthma. Presenting with nausea. Symptoms started yesterday. He thought it may have been due to a weekend of partying at the beach. So he tried resting. However, he woke up feeling very fatigued and still nauseous. He checked his sugars and saw they were high. He decided to come to the ED for help. He reports recent excessive EtOH intake, but says this is not a normal habit. He denies any other aggravating or alleviating symptoms.   ED Course: Found to be in DKA. He was started on insulin gtt and fluids. [facility] was called for admission.   Review of Systems:  Denies CP, palpitations, dyspnea, syncopal episodes, V/D/F, sick contact. Review of systems is otherwise negative for all not mentioned in HPI.   PMHx Past Medical History:  Diagnosis Date  . Asthma   . Diabetes mellitus without complication (HCC)   . Hypertension     PSHx History reviewed. No pertinent surgical history.  SocHx  reports that he has been smoking cigars. He has never used smokeless tobacco. He reports current alcohol use. He reports current drug use. Drug: Marijuana.  No Known Allergies  FamHx No family history on file.  Prior to Admission medications   Medication Sig Start Date End Date Taking? Authorizing Provider  albuterol (VENTOLIN HFA) 108 (90 Base) MCG/ACT inhaler Inhale 1-2 puffs into the lungs every 6 (six) hours as needed for wheezing or shortness of breath. 06/11/20   Carroll Sage, PA-C  diphenhydrAMINE (BENADRYL) 25 MG tablet Take 1 tablet (25 mg total) by mouth every 6 (six) hours as needed for itching or allergies. 01/21/18   Fayrene Helper, PA-C  Insulin NPH Isophane & Regular (HUMULIN 70/30 [place]) Inject into the skin.    [provider]  lisinopril (PRINIVIL,ZESTRIL) 20 MG tablet Take 20 mg by mouth daily.    [provider]  metFORMIN (GLUCOPHAGE) 1000 MG tablet Take by mouth. 10/30/16   [provider]  metFORMIN (GLUCOPHAGE) 500 MG tablet Take 500 mg by mouth 2 (two) times daily with a meal.    [provider]  ondansetron (ZOFRAN ODT) 4 MG disintegrating tablet Take 1 tablet (4 mg total) by mouth every 8 (eight) hours as needed. 12/25/18   Pricilla Loveless, MD  ondansetron (ZOFRAN) 4 MG tablet Take 1 tablet (4 mg total) by mouth every 8 (eight) hours as needed for nausea or vomiting. 01/21/18   Fayrene Helper, PA-C  pantoprazole (PROTONIX) 40 MG tablet Take 1 tablet (40 mg total) by mouth daily. 12/25/18   Pricilla Loveless, MD  simvastatin (ZOCOR) 40 MG tablet Take 40 mg by mouth daily.    [provider]    Physical Exam: Vitals:   04/15/21 1300 04/15/21 1315 04/15/21 1330 04/15/21 1455  BP: (!) 163/95 (!) 159/96 (!) 158/98   Pulse: (!) 117 (!) 115 (!) 115   Resp: 20 16 18    Temp:    97.7 F (36.5 C)  TempSrc:    Oral  SpO2: 100% 100% 100%   Weight:      Height:        General: 30 y.o. male resting in bed in NAD Eyes: PERRL, normal sclera ENMT: Nares patent w/o discharge, orophaynx clear, dentition normal, ears w/o discharge/lesions/ulcers  Neck: Supple, trachea midline Cardiovascular: tachy, +S1, S2, no m/g/r, equal pulses throughout Respiratory: CTABL, no w/r/r, normal WOB GI: BS+, NDNT, no masses noted, no organomegaly noted MSK: No e/c/c Skin: No rashes, bruises, ulcerations noted Neuro: A&O x 3, no focal deficits Psyc: flat affect, calm, cooperative but very slow to answer and engage  Labs on Admission: I have personally reviewed following labs and imaging studies  CBC: Recent Labs  Lab 04/15/21 1052 04/15/21 1109 04/15/21 1343  WBC 7.8  --   --   NEUTROABS 5.7  --   --   HGB 17.4* 19.4* 17.0  17.3*  HCT 52.7* 57.0* 50.0  51.0  MCV 77.4*  --   --    PLT 388  --   --    Basic Metabolic Panel: Recent Labs  Lab 04/15/21 1052 04/15/21 1109 04/15/21 1343  NA 132* 135 139  141  K 5.2* 6.1* 6.7*  6.5*  CL 101  --  122*  CO2 <7*  --   --   GLUCOSE 542*  --  365*  BUN 26*  --  38*  CREATININE 1.44*  --  1.10  CALCIUM 9.2  --   --   MG 3.2*  --   --    GFR: Estimated Creatinine Clearance: 98.7 mL/min (by C-G formula based on SCr of 1.1 mg/dL). Liver Function Tests: Recent Labs  Lab 04/15/21 1052  AST 21  ALT 19  ALKPHOS 122  BILITOT 1.6*  PROT 8.0  ALBUMIN 4.3   Recent Labs  Lab 04/15/21 1052  LIPASE 24   No results for input(s): AMMONIA in the last 168 hours. Coagulation Profile: No results for input(s): INR, PROTIME in the last 168 hours. Cardiac Enzymes: No results for input(s): CKTOTAL, CKMB, CKMBINDEX, TROPONINI in the last 168 hours. BNP (last 3 results) No results for input(s): PROBNP in the last 8760 hours. HbA1C: No results for input(s): HGBA1C in the last 72 hours. CBG: Recent Labs  Lab 04/15/21 1154 04/15/21 1227 04/15/21 1302 04/15/21 1341 04/15/21 1446  GLUCAP 460* 402* 454* 349* 267*   Lipid Profile: No results for input(s): CHOL, HDL, LDLCALC, TRIG, CHOLHDL, LDLDIRECT in the last 72 hours. Thyroid Function Tests: No results for input(s): TSH, T4TOTAL, FREET4, T3FREE, THYROIDAB in the last 72 hours. Anemia Panel: No results for input(s): VITAMINB12, FOLATE, FERRITIN, TIBC, IRON, RETICCTPCT in the last 72 hours. Urine analysis:    Component Value Date/Time   COLORURINE YELLOW 04/15/2021 1236   APPEARANCEUR CLEAR 04/15/2021 1236   LABSPEC >1.030 (H) 04/15/2021 1236   PHURINE 5.5 04/15/2021 1236   GLUCOSEU >=500 (A) 04/15/2021 1236   HGBUR SMALL (A) 04/15/2021 1236   BILIRUBINUR NEGATIVE 04/15/2021 1236   KETONESUR >80 (A) 04/15/2021 1236   PROTEINUR 100 (A) 04/15/2021 1236   NITRITE NEGATIVE 04/15/2021 1236   LEUKOCYTESUR NEGATIVE 04/15/2021 1236    Radiological Exams on  Admission: No results found.  EKG: Independently reviewed. Sinus tach, no st elevations  Assessment/Plan DKA Uncontrolled DM2     - admit to inpatient, step down     - continue insulin gtt, fluids, q4h BMP, follow beta-hydroxybutyric acid     - check A1c     - NPO except for non-calorics  HTN     - PRN hydralazine for now     - start ARB or ACEi in light of proteinuria when able to take PO  Asthma     - PRN albuterol and home inhalers  EtOH binge/abuse     -  counsel against further use     - check thiamine, B12, folate     - add CIWA  Proteinuria     - likely from uncontrolled HTN and DM     - check urine protein to creatinine ratio     - nephrology outpt follow up  Hypertriglyceridemia     - continue insulin gtt     - follow triglycerides      - start finofibrate and statin when taking PO     - no complaints of abdominal pain and lipase is normal  Hyperbilirubinemia     - check fractionated bili  AKI     - as a fxn of DKA, now resolved  Hyperkalemia     - switch fluids from LR-based to NS-based; continue insulin gtt     - follow BMP; tele  DVT prophylaxis: lovenox  Code Status: FULL  Family Communication: None at bedside  Consults called: None   Status is: Inpatient  Remains inpatient appropriate because:Inpatient level of care appropriate due to severity of illness   Dispo: The patient is from: Home              Anticipated d/c is to: Home              Patient currently is not medically stable to d/c.   Difficult to place patient No  Time spent coordinating admission: 90 minutes  Aliyha Fornes A Markel Mergenthaler DO [facility]  If 7PM-7AM, please contact night-coverage [email or web address]  04/15/2021, 3:02 PM

## 2021-04-16 LAB — BASIC METABOLIC PANEL
Anion gap: 9 (ref 5–15)
Anion gap: 7 (ref 5–15)
Anion gap: 13 (ref 5–15)
Anion gap: 7 (ref 5–15)
Anion gap: 6 (ref 5–15)
Anion gap: 6 (ref 5–15)
BUN: 19 mg/dL (ref 6–20)
BUN: 21 mg/dL — ABNORMAL HIGH (ref 6–20)
BUN: 22 mg/dL — ABNORMAL HIGH (ref 6–20)
BUN: 20 mg/dL (ref 6–20)
BUN: 18 mg/dL (ref 6–20)
BUN: 19 mg/dL (ref 6–20)
CO2: 18 mmol/L — ABNORMAL LOW (ref 22–32)
CO2: 19 mmol/L — ABNORMAL LOW (ref 22–32)
CO2: 17 mmol/L — ABNORMAL LOW (ref 22–32)
CO2: 22 mmol/L (ref 22–32)
CO2: 26 mmol/L (ref 22–32)
CO2: 24 mmol/L (ref 22–32)
Calcium: 9 mg/dL (ref 8.9–10.3)
Calcium: 8.6 mg/dL — ABNORMAL LOW (ref 8.9–10.3)
Calcium: 8.6 mg/dL — ABNORMAL LOW (ref 8.9–10.3)
Calcium: 8.7 mg/dL — ABNORMAL LOW (ref 8.9–10.3)
Calcium: 9 mg/dL (ref 8.9–10.3)
Calcium: 8.4 mg/dL — ABNORMAL LOW (ref 8.9–10.3)
Chloride: 115 mmol/L — ABNORMAL HIGH (ref 98–111)
Chloride: 114 mmol/L — ABNORMAL HIGH (ref 98–111)
Chloride: 109 mmol/L (ref 98–111)
Chloride: 110 mmol/L (ref 98–111)
Chloride: 108 mmol/L (ref 98–111)
Chloride: 106 mmol/L (ref 98–111)
Creatinine, Ser: 0.95 mg/dL (ref 0.61–1.24)
Creatinine, Ser: 0.87 mg/dL (ref 0.61–1.24)
Creatinine, Ser: 1.03 mg/dL (ref 0.61–1.24)
Creatinine, Ser: 0.81 mg/dL (ref 0.61–1.24)
Creatinine, Ser: 0.78 mg/dL (ref 0.61–1.24)
Creatinine, Ser: 0.59 mg/dL — ABNORMAL LOW (ref 0.61–1.24)
GFR, Estimated: >60 mL/min (ref >60)
GFR, Estimated: >60 mL/min (ref >60)
GFR, Estimated: >60 mL/min (ref >60)
GFR, Estimated: >60 mL/min (ref >60)
GFR, Estimated: >60 mL/min (ref >60)
GFR, Estimated: >60 mL/min (ref >60)
Glucose, Bld: 148 mg/dL — ABNORMAL HIGH (ref 70–99)
Glucose, Bld: 177 mg/dL — ABNORMAL HIGH (ref 70–99)
Glucose, Bld: 265 mg/dL — ABNORMAL HIGH (ref 70–99)
Glucose, Bld: 169 mg/dL — ABNORMAL HIGH (ref 70–99)
Glucose, Bld: 107 mg/dL — ABNORMAL HIGH (ref 70–99)
Glucose, Bld: 241 mg/dL — ABNORMAL HIGH (ref 70–99)
Potassium: 4 mmol/L (ref 3.5–5.1)
Potassium: 3.8 mmol/L (ref 3.5–5.1)
Potassium: 3.7 mmol/L (ref 3.5–5.1)
Potassium: 3 mmol/L — ABNORMAL LOW (ref 3.5–5.1)
Potassium: 3.2 mmol/L — ABNORMAL LOW (ref 3.5–5.1)
Potassium: 3.4 mmol/L — ABNORMAL LOW (ref 3.5–5.1)
Sodium: 142 mmol/L (ref 135–145)
Sodium: 140 mmol/L (ref 135–145)
Sodium: 139 mmol/L (ref 135–145)
Sodium: 139 mmol/L (ref 135–145)
Sodium: 140 mmol/L (ref 135–145)
Sodium: 136 mmol/L (ref 135–145)

## 2021-04-16 LAB — COMPREHENSIVE METABOLIC PANEL
ALT: 19 U/L (ref 0–44)
AST: 21 U/L (ref 15–41)
Albumin: 4.3 g/dL (ref 3.5–5.0)
Alkaline Phosphatase: 122 U/L (ref 38–126)
BUN: 26 mg/dL — ABNORMAL HIGH (ref 6–20)
CO2: <7 mmol/L — ABNORMAL LOW (ref 22–32)
Calcium: 9.2 mg/dL (ref 8.9–10.3)
Chloride: 101 mmol/L (ref 98–111)
Creatinine, Ser: 1.44 mg/dL — ABNORMAL HIGH (ref 0.61–1.24)
GFR, Estimated: >60 mL/min (ref >60)
Glucose, Bld: 542 mg/dL (ref 70–99)
Potassium: 5.2 mmol/L — ABNORMAL HIGH (ref 3.5–5.1)
Sodium: 132 mmol/L — ABNORMAL LOW (ref 135–145)
Total Bilirubin: 1.6 mg/dL — ABNORMAL HIGH (ref 0.3–1.2)
Total Protein: 8 g/dL (ref 6.5–8.1)

## 2021-04-16 LAB — HIV ANTIBODY (ROUTINE TESTING W REFLEX): HIV Screen 4th Generation wRfx: NONREACTIVE

## 2021-04-16 LAB — GLUCOSE, CAPILLARY
Glucose-Capillary: 180 mg/dL — ABNORMAL HIGH (ref 70–99)
Glucose-Capillary: 276 mg/dL — ABNORMAL HIGH (ref 70–99)
Glucose-Capillary: 289 mg/dL — ABNORMAL HIGH (ref 70–99)
Glucose-Capillary: 284 mg/dL — ABNORMAL HIGH (ref 70–99)
Glucose-Capillary: 212 mg/dL — ABNORMAL HIGH (ref 70–99)
Glucose-Capillary: 173 mg/dL — ABNORMAL HIGH (ref 70–99)
Glucose-Capillary: 143 mg/dL — ABNORMAL HIGH (ref 70–99)
Glucose-Capillary: 109 mg/dL — ABNORMAL HIGH (ref 70–99)
Glucose-Capillary: 114 mg/dL — ABNORMAL HIGH (ref 70–99)
Glucose-Capillary: 102 mg/dL — ABNORMAL HIGH (ref 70–99)
Glucose-Capillary: 263 mg/dL — ABNORMAL HIGH (ref 70–99)
Glucose-Capillary: 275 mg/dL — ABNORMAL HIGH (ref 70–99)
Glucose-Capillary: 335 mg/dL — ABNORMAL HIGH (ref 70–99)

## 2021-04-16 LAB — LIPID PANEL
Cholesterol: 459 mg/dL — ABNORMAL HIGH (ref 0–200)
HDL: 39 mg/dL — ABNORMAL LOW (ref >40)
LDL Cholesterol: UNDETERMINED mg/dL (ref 0–99)
Total CHOL/HDL Ratio: 11.8 RATIO
Triglycerides: 1035 mg/dL — ABNORMAL HIGH (ref <150)
VLDL: UNDETERMINED mg/dL (ref 0–40)

## 2021-04-16 LAB — BETA-HYDROXYBUTYRIC ACID
Beta-Hydroxybutyric Acid: 1.98 mmol/L — ABNORMAL HIGH (ref 0.05–0.27)
Beta-Hydroxybutyric Acid: 2.15 mmol/L — ABNORMAL HIGH (ref 0.05–0.27)
Beta-Hydroxybutyric Acid: 5.07 mmol/L — ABNORMAL HIGH (ref 0.05–0.27)

## 2021-04-16 LAB — LDL CHOLESTEROL, DIRECT: Direct LDL: 50.7 mg/dL (ref 0–99)

## 2021-04-16 MED ORDER — DEXTROSE IN LACTATED RINGERS 5 % IV SOLN: INTRAVENOUS | Status: DC

## 2021-04-16 MED ORDER — INSULIN GLARGINE 100 UNIT/ML ~~LOC~~ SOLN
10.0000 [IU] | Freq: Every day | SUBCUTANEOUS | Status: DC
  Administered 2021-04-16: 10 [IU] via SUBCUTANEOUS
  Filled 2021-04-16: qty 0.1

## 2021-04-16 MED ORDER — INSULIN REGULAR(HUMAN) IN NACL 100-0.9 UT/100ML-% IV SOLN: INTRAVENOUS | Status: AC

## 2021-04-16 MED ORDER — LACTATED RINGERS IV SOLN: INTRAVENOUS | Status: DC

## 2021-04-16 MED ORDER — DEXTROSE-NACL 5-0.45 % IV SOLN: INTRAVENOUS | Status: DC

## 2021-04-16 MED ORDER — INSULIN GLARGINE 100 UNIT/ML ~~LOC~~ SOLN
10.0000 [IU] | Freq: Every day | SUBCUTANEOUS | Status: DC
  Administered 2021-04-16 – 2021-04-17 (×2): 10 [IU] via SUBCUTANEOUS
  Filled 2021-04-16 (×2): qty 0.1

## 2021-04-16 MED ORDER — INSULIN ASPART 100 UNIT/ML IJ SOLN
0.0000 [IU] | Freq: Three times a day (TID) | INTRAMUSCULAR | Status: DC
Start: 2021-04-16 — End: 2021-04-17
  Administered 2021-04-16 (×2): 5 [IU] via SUBCUTANEOUS
  Administered 2021-04-16: 9 [IU] via SUBCUTANEOUS
  Administered 2021-04-17: 5 [IU] via SUBCUTANEOUS
  Administered 2021-04-17: 3 [IU] via SUBCUTANEOUS

## 2021-04-16 MED ORDER — SODIUM CHLORIDE 0.9 % IV SOLN: INTRAVENOUS | Status: DC

## 2021-04-16 MED ORDER — INSULIN REGULAR(HUMAN) IN NACL 100-0.9 UT/100ML-% IV SOLN: INTRAVENOUS | Status: DC

## 2021-04-16 MED ORDER — INSULIN ASPART 100 UNIT/ML IJ SOLN: 0.0000 [IU] | Freq: Every day | INTRAMUSCULAR | Status: DC

## 2021-04-16 MED ORDER — INSULIN REGULAR(HUMAN) IN NACL 100-0.9 UT/100ML-% IV SOLN
INTRAVENOUS | Status: AC
  Administered 2021-04-16: 14 [IU]/h via INTRAVENOUS

## 2021-04-16 MED ORDER — POTASSIUM CHLORIDE 20 MEQ PO PACK
40.0000 meq | PACK | Freq: Once | ORAL | Status: AC
  Administered 2021-04-16: 40 meq via ORAL
  Filled 2021-04-16: qty 2

## 2021-04-16 NOTE — TOC Initial Note (Signed)
Transition of Care [facility]) - Initial/Assessment Note    Patient Details  Name: Francisco Davila MRN: [id number] Date of Birth: Nov 26, 1991  [facility]) CM/SW Contact:    Golda Acre, RN Phone Number: 04/16/2021, 8:46 AM  Clinical Narrative:                 30 y.o. male with medical history significant of HTN, DM2, asthma. Presenting with nausea. Symptoms started yesterday. He thought it may have been due to a weekend of partying at the beach. So he tried resting. However, he woke up feeling very fatigued and still nauseous. He checked his sugars and saw they were high. He decided to come to the ED for help. He reports recent excessive EtOH intake, but says this is not a normal habit. He denies any other aggravating or alleviating symptoms.   ED Course: Found to be in DKA. He was started on insulin gtt and fluids. [facility] was called for admission.   Review of Systems:  Denies CP, palpitations, dyspnea, syncopal episodes, V/D/F, sick contact. Review of systems is otherwise negative for all not mentioned in HPI.  PLAN:to return home with self care.  Has family support. Expected Discharge Plan: Home/Self Care Barriers to Discharge: Continued Medical Work up   Patient Goals and CMS Choice Patient states their goals for this hospitalization and ongoing recovery are:: to go home CMS Medicare.gov Compare Post Acute Care list provided to:: Patient    Expected Discharge Plan and Services Expected Discharge Plan: Home/Self Care   Discharge Planning Services: CM Consult   Living arrangements for the past 2 months: Single Family Home                                      Prior Living Arrangements/Services Living arrangements for the past 2 months: Single Family Home Lives with:: Self Patient language and need for interpreter reviewed:: Yes        Need for Family Participation in Patient Care: Yes (Comment) (family members) Care giver support system in place?: Yes  (comment)   Criminal Activity/Legal Involvement Pertinent to Current Situation/Hospitalization: No - Comment as needed  Activities of Daily Living Home Assistive Devices/Equipment: CBG Meter ADL Screening (condition at time of admission) Patient's cognitive ability adequate to safely complete daily activities?: Yes Is the patient deaf or have difficulty hearing?: No Does the patient have difficulty seeing, even when wearing glasses/contacts?: No Does the patient have difficulty concentrating, remembering, or making decisions?: No Patient able to express need for assistance with ADLs?: Yes Does the patient have difficulty dressing or bathing?: Yes (secondary to weakness and lethargy) Independently performs ADLs?: No Communication: Independent Dressing (OT): Needs assistance Is this a change from baseline?: Change from baseline, expected to last <3days Grooming: Needs assistance Is this a change from baseline?: Change from baseline, expected to last <3 days Feeding: Needs assistance Is this a change from baseline?: Change from baseline, expected to last <3 days Bathing: Needs assistance Is this a change from baseline?: Change from baseline, expected to last <3 days Toileting: Needs assistance Is this a change from baseline?: Change from baseline, expected to last <3 days In/Out Bed: Needs assistance Is this a change from baseline?: Change from baseline, expected to last <3 days Walks in Home: Needs assistance Is this a change from baseline?: Change from baseline, expected to last <3 days Does the patient have difficulty walking or  climbing stairs?: Yes (secondary to weakness and lethargy) Weakness of Legs: Both Weakness of Arms/Hands: Both  Permission Sought/Granted                  Emotional Assessment Appearance:: Appears stated age Attitude/Demeanor/Rapport: Engaged Affect (typically observed): Calm Orientation: : Oriented to Self,Oriented to Place,Oriented to   Time,Oriented to Situation Alcohol / Substance Use: Not Applicable Psych Involvement: No (comment)  Admission diagnosis:  DKA (diabetic ketoacidosis) (HCC) [E11.10] Patient Active Problem List   Diagnosis Date Noted  . DKA (diabetic ketoacidosis) (HCC) 04/15/2021   PCP:  [facility] Pharmacy:   [facility] [phone] - [place], [place] - [address] [place] [address] Phone: [phone] Fax: [phone]     Social Determinants of Health (SDOH) Interventions    Readmission Risk Interventions No flowsheet data found.

## 2021-04-16 NOTE — Progress Notes (Signed)
After receiving report, this RN noted patient's labs just prior to insulin gtt were CO2 18 and beta-hydroxy 2.15 (0006 lab draw). Lantus 10 units were ordered at 0220 by Dr. Delma Post and administered at [phone]. Insulin gtt was turned off at 0256. Notified Dr. Lucianne Muss, BMP and beta-hydroxy were ordered and collected. CO2 17, anion gap 13, beta-hydroxy 5.07. Dr. Lucianne Muss notified, insulin gtt restarted.

## 2021-04-16 NOTE — Progress Notes (Signed)
Per MD, insulin gtt to be discontinued now, without administration of lantus. Patient was on insulin gtt overnight and received 10 units of lantus just prior to 0300. Lantus to be administered at bedtime

## 2021-04-16 NOTE — Progress Notes (Signed)
PROGRESS NOTE    Francisco Davila  JXB:[id number] DOB: Mar 12, 1991 DOA: 04/15/2021 PCP: [facility]    Brief Narrative: This 30 years old male with PMH significant for HTN, DM2, asthma presented in the ED with nausea, mild abdominal pain.  Patient reports symptoms started yesterday after he has spent the weekend of partying at the beach.  He reports he tried to rest however he woke up feeling very fatigued and nauseous.  He checked his blood sugar which was  very high.  He decided come to the ED.  He reports excessive EtOH intake and says this was not normal habit. Patient was found to be in DKA and was started on insulin gtt. and fluids.  Assessment & Plan:   Active Problems:   DKA (diabetic ketoacidosis) (HCC)   High anion gap metabolic acidosis secondary to DKA: Patient report putting on the weekend, excessive EtOH intake. Presented with nausea and fatigue. Blood glucose 542 and anion gap not calculable, BHBA 3.82 ABG: pH 7.1, PCO2 24 PO2 83 bicarb 7.7 Patient admitted in the ICU and started on DKA protocol. Continue insulin gtt and IV hydration. FS Improving, 265, 177, anion gap 13 BHBA 5.07 Recheck BMP, will transition to subcu insulin.  Hypertension: Continue hydralazine as needed Consider adding ACE inhibitor's in the light of proteinuria.  Asthma: Continue albuterol and home inhalers.  Proteinuria: Most likely from uncontrolled hypertension and diabetes Outpatient nephrology follow-up.  Hypertriglyceridemia: Start fenofibrate and statins when taking p.o.  AKI: Resolved with IV hydration   DVT prophylaxis:Lovenox Code Status: Full code. Family Communication:  No family at bed side. Disposition Plan:  Status is: Inpatient  Remains inpatient appropriate because:Inpatient level of care appropriate due to severity of illness   Dispo: The patient is from: Home              Anticipated d/c is to: Home              Patient currently is  not medically stable to d/c.   Difficult to place patient No   Consultants:    None  Procedures: None. Antimicrobials:   Anti-infectives (From admission, onward)   None      Subjective: Patient was seen and examined at bedside.  Overnight events noted.   Patient reports feeling better,  he states he did a lot of partying on the weekend and he has a lot of regret.   He denies any pain,  reports nausea is improving.  Objective: Vitals:   04/16/21 0900 04/16/21 1000 04/16/21 1100 04/16/21 1200  BP: (!) 146/111 135/78 120/72 119/69  Pulse:  71 70 66  Resp: 12 12 12 12   Temp:      TempSrc:      SpO2:  100% 100% 100%  Weight:      Height:        Intake/Output Summary (Last 24 hours) at 04/16/2021 1320 Last data filed at 04/16/2021 0827 Gross per 24 hour  Intake 2132.35 ml  Output 1100 ml  Net 1032.35 ml   Filed Weights   04/15/21 1022  Weight: 71.7 kg    Examination:  General exam: Appears calm and comfortable, not in any acute distress Respiratory system: Clear to auscultation. Respiratory effort normal. Cardiovascular system: S1 & S2 heard, RRR. No JVD, murmurs, rubs, gallops or clicks. No pedal edema. Gastrointestinal system: Abdomen is nondistended, soft and nontender. No organomegaly or masses felt. Normal bowel sounds heard. Central nervous system: Alert and oriented. No focal  neurological deficits. Extremities: Symmetric 5 x 5 power.  No edema, no cyanosis, no clubbing. Skin: No rashes, lesions or ulcers Psychiatry: Judgement and insight appear normal. Mood & affect appropriate.     Data Reviewed: I have personally reviewed following labs and imaging studies  CBC: Recent Labs  Lab 04/15/21 1052 04/15/21 1109 04/15/21 1343  WBC 7.8  --   --   NEUTROABS 5.7  --   --   HGB 17.4* 19.4* 17.0  17.3*  HCT 52.7* 57.0* 50.0  51.0  MCV 77.4*  --   --   PLT 388  --   --    Basic Metabolic Panel: Recent Labs  Lab 04/15/21 1052 04/15/21 1109  04/15/21 1343 04/15/21 1913 04/16/21 0006 04/16/21 0309 04/16/21 0810  NA 132*   < > 139  141 140 142 140 139  K 5.2*   < > 6.7*  6.5* 4.0 4.0 3.8 3.7  CL 101  --  122* 114* 115* 114* 109  CO2 <7*  --   --  16* 18* 19* 17*  GLUCOSE 542*  --  365* 207* 148* 177* 265*  BUN 26*  --  38* 20 19 21* 22*  CREATININE 1.44*  --  1.10 0.97 0.95 0.87 1.03  CALCIUM 9.2  --   --  8.5* 9.0 8.6* 8.6*  MG 3.2*  --   --   --   --   --   --    < > = values in this interval not displayed.   GFR: Estimated Creatinine Clearance: 105.4 mL/min (by C-G formula based on SCr of 1.03 mg/dL). Liver Function Tests: Recent Labs  Lab 04/15/21 1052 04/15/21 1913  AST 21  --   ALT 19  --   ALKPHOS 122  --   BILITOT 1.6* 0.8  PROT 8.0  --   ALBUMIN 4.3  --    Recent Labs  Lab 04/15/21 1052  LIPASE 24   No results for input(s): AMMONIA in the last 168 hours. Coagulation Profile: No results for input(s): INR, PROTIME in the last 168 hours. Cardiac Enzymes: No results for input(s): CKTOTAL, CKMB, CKMBINDEX, TROPONINI in the last 168 hours. BNP (last 3 results) No results for input(s): PROBNP in the last 8760 hours. HbA1C: No results for input(s): HGBA1C in the last 72 hours. CBG: Recent Labs  Lab 04/16/21 0738 04/16/21 0921 04/16/21 1022 04/16/21 1132 04/16/21 1231  GLUCAP 276* 289* 284* 212* 173*   Lipid Profile: Recent Labs    04/15/21 1052 04/16/21 0006  CHOL 748* 459*  HDL NOT REPORTED DUE TO HIGH TRIGLYCERIDES 39*  LDLCALC UNABLE TO CALCULATE IF TRIGLYCERIDE OVER 400 mg/dL UNABLE TO CALCULATE IF TRIGLYCERIDE OVER 400 mg/dL  TRIG >1,610>5,000* 9,6041,035*  CHOLHDL NOT REPORTED DUE TO HIGH TRIGLYCERIDES 11.8  LDLDIRECT <10 50.7   Thyroid Function Tests: No results for input(s): TSH, T4TOTAL, FREET4, T3FREE, THYROIDAB in the last 72 hours. Anemia Panel: Recent Labs    04/15/21 1913  VITAMINB12 1,232*  FOLATE 23.2   Sepsis Labs: No results for input(s): PROCALCITON, LATICACIDVEN in  the last 168 hours.  Recent Results (from the past 240 hour(s))  Resp Panel by RT-PCR (Flu A&B, Covid) Nasopharyngeal Swab     Status: None   Collection Time: 04/15/21 12:36 PM   Specimen: Nasopharyngeal Swab; Nasopharyngeal(NP) swabs in vial transport medium  Result Value Ref Range Status   SARS Coronavirus 2 by RT PCR NEGATIVE NEGATIVE Final    Comment: (NOTE) SARS-CoV-2 target nucleic  acids are NOT DETECTED.  The SARS-CoV-2 RNA is generally detectable in upper respiratory specimens during the acute phase of infection. The lowest concentration of SARS-CoV-2 viral copies this assay can detect is 138 copies/mL. A negative result does not preclude SARS-Cov-2 infection and should not be used as the sole basis for treatment or other patient management decisions. A negative result may occur with  improper specimen collection/handling, submission of specimen other than nasopharyngeal swab, presence of viral mutation(s) within the areas targeted by this assay, and inadequate number of viral copies(<138 copies/mL). A negative result must be combined with clinical observations, patient history, and epidemiological information. The expected result is Negative.  Fact Sheet for Patients:  BloggerCourse.com  Fact Sheet for Healthcare Providers:  SeriousBroker.it  This test is no t yet approved or cleared by the [place] FDA and  has been authorized for detection and/or diagnosis of SARS-CoV-2 by FDA under an Emergency Use Authorization (EUA). This EUA will remain  in effect (meaning this test can be used) for the duration of the COVID-19 declaration under Section 564(b)(1) of the Act, 21 U.S.C.section 360bbb-3(b)(1), unless the authorization is terminated  or revoked sooner.       Influenza A by PCR NEGATIVE NEGATIVE Final   Influenza B by PCR NEGATIVE NEGATIVE Final    Comment: (NOTE) The Xpert Xpress SARS-CoV-2/FLU/RSV plus assay  is intended as an aid in the diagnosis of influenza from Nasopharyngeal swab specimens and should not be used as a sole basis for treatment. Nasal washings and aspirates are unacceptable for Xpert Xpress SARS-CoV-2/FLU/RSV testing.  Fact Sheet for Patients: BloggerCourse.com  Fact Sheet for Healthcare Providers: SeriousBroker.it  This test is not yet approved or cleared by the [place] FDA and has been authorized for detection and/or diagnosis of SARS-CoV-2 by FDA under an Emergency Use Authorization (EUA). This EUA will remain in effect (meaning this test can be used) for the duration of the COVID-19 declaration under Section 564(b)(1) of the Act, 21 U.S.C. section 360bbb-3(b)(1), unless the authorization is terminated or revoked.  Performed at [facility], [address] [place], [place] [address]   MRSA PCR Screening     Status: None   Collection Time: 04/15/21  3:00 PM   Specimen: Nasal Mucosa; Nasopharyngeal  Result Value Ref Range Status   MRSA by PCR NEGATIVE NEGATIVE Final    Comment:        The GeneXpert MRSA Assay (FDA approved for NASAL specimens only), is one component of a comprehensive MRSA colonization surveillance program. It is not intended to diagnose MRSA infection nor to guide or monitor treatment for MRSA infections. Performed at [facility], [address], [place], [place] [address]     Radiology Studies: No results found.  Scheduled Meds: . Chlorhexidine Gluconate Cloth  6 each Topical Daily  . enoxaparin (LOVENOX) injection  40 mg Subcutaneous Q24H  . fluticasone furoate-vilanterol  1 puff Inhalation Daily  . folic acid  1 mg Oral Daily  . insulin aspart  0-5 Units Subcutaneous QHS  . insulin aspart  0-9 Units Subcutaneous TID WC  . insulin glargine  10 Units Subcutaneous Daily  . multivitamin with minerals  1 tablet Oral Daily  . thiamine  100 mg Oral  Daily   Or  . thiamine  100 mg Intravenous Daily   Continuous Infusions: . dextrose 5% lactated ringers 125 mL/hr at 04/16/21 1135  . insulin    . lactated ringers 100 mL/hr at 04/16/21  0827     LOS: 1 day    Time spent: 35 mins    Marnae Madani, MD [facility]   If 7PM-7AM, please contact night-coverage

## 2021-04-17 ENCOUNTER — Encounter (HOSPITAL_COMMUNITY): Payer: Self-pay

## 2021-04-17 LAB — BASIC METABOLIC PANEL
Anion gap: 7 (ref 5–15)
BUN: 17 mg/dL (ref 6–20)
CO2: 22 mmol/L (ref 22–32)
Calcium: 8.3 mg/dL — ABNORMAL LOW (ref 8.9–10.3)
Chloride: 105 mmol/L (ref 98–111)
Creatinine, Ser: 0.6 mg/dL — ABNORMAL LOW (ref 0.61–1.24)
GFR, Estimated: >60 mL/min (ref >60)
Glucose, Bld: 226 mg/dL — ABNORMAL HIGH (ref 70–99)
Potassium: 3.5 mmol/L (ref 3.5–5.1)
Sodium: 134 mmol/L — ABNORMAL LOW (ref 135–145)

## 2021-04-17 LAB — MAGNESIUM: Magnesium: 1.9 mg/dL (ref 1.7–2.4)

## 2021-04-17 LAB — PHOSPHORUS: Phosphorus: 2.3 mg/dL — ABNORMAL LOW (ref 2.5–4.6)

## 2021-04-17 LAB — GLUCOSE, CAPILLARY
Glucose-Capillary: 240 mg/dL — ABNORMAL HIGH (ref 70–99)
Glucose-Capillary: 288 mg/dL — ABNORMAL HIGH (ref 70–99)

## 2021-04-17 LAB — HEMOGLOBIN A1C

## 2021-04-17 MED ORDER — FENOFIBRATE 48 MG PO TABS: 48.0000 mg | ORAL_TABLET | Freq: Every day | ORAL | 11 refills | Status: AC

## 2021-04-17 MED ORDER — ATORVASTATIN CALCIUM 20 MG PO TABS: 20.0000 mg | ORAL_TABLET | Freq: Every day | ORAL | 11 refills | Status: AC

## 2021-04-17 MED ORDER — INSULIN GLARGINE 100 UNIT/ML SOLOSTAR PEN: 14.0000 [IU] | PEN_INJECTOR | Freq: Every day | SUBCUTANEOUS | 2 refills | Status: AC

## 2021-04-17 MED ORDER — INSULIN ASPART 100 UNIT/ML FLEXPEN: 5.0000 [IU] | PEN_INJECTOR | Freq: Three times a day (TID) | SUBCUTANEOUS | 2 refills | Status: AC

## 2021-04-17 NOTE — Discharge Summary (Signed)
Physician Discharge Summary  Francisco Davila ZOX:[id number] DOB: 21-May-1991 DOA: 04/15/2021  PCP: [facility]  Admit date: 04/15/2021   Discharge date: 04/17/2021  Admitted From:  Home. Disposition:  Home.  Recommendations for Outpatient Follow-up:  1. Follow up with PCP in 1-2 weeks. 2. Please obtain BMP/CBC in one week 3.   Advised to follow-up with endocrinologist. 4.   Advised to take Lantus 14 units daily and NovoLog 5 units 3 times daily with meals. 5.   Advised to take Lipitor and fenofibrate daily.  Home Health: None Equipment/Devices: None  Discharge Condition: Stable CODE STATUS:Full code Diet recommendation: Carb Modified.  Brief Summary / Hospital course: This 30 years old male with PMH significant for HTN, DM2, asthma presented in the ED with nausea, vomiting and mild abdominal pain.  Patient reports symptoms started yesterday after he has spent the whole weekend of partying at the beach.  He reports he tried to rest however he woke up feeling very fatigued and nauseous.  He checked his blood sugar which was  very high.  He decided come to the ED.  He reports excessive EtOH intake and says this was not normal habit. Patient was found to be in DKA and was started on insulin gtt. and fluids.   Patient was admitted for high anion gap metabolic acidosis secondary to diabetic ketoacidosis.  Patient was admitted in ICU and started on insulin infusion according to the DKA protocol.  He was continued on IV fluids.  Once anion gap closed, he was transitioned to long-acting subcu insulin and sliding scale.  Patient felt better,  Patient was started on carb modified diet.  Dietitian consult completed.  Patient feels better,  ambulated in the hallway and wants to be discharged.  Patient is also started on Lipitor 20 mg daily and fenofibrate 48 mg daily for hypertriglyceridemia.  Patient is being discharged home on Lantus 14 units daily and NovoLog 5 units 3 times  daily.  Patient has appointment with endocrinologist.  Patient is being discharged home.  He was managed for below problems.   Discharge Diagnoses:  Active Problems:   DKA (diabetic ketoacidosis) (HCC)  High anion gap metabolic acidosis secondary to DKA: > Resolved. Patient report drinking on the weekend, excessive EtOH intake. Presented with nausea and fatigue. Blood glucose 542 and anion gap not calculable, BHBA 3.82 ABG: pH 7.1, PCO2 24 PO2 83 bicarb 7.7 Patient admitted in the ICU and started on DKA protocol. Continue insulin gtt and IV hydration. FS Improving, 265, 177, anion gap 13 BHBA 5.07 Anion gap closed.  Successfully transitioned to subcu insulin.   Started on carb modified diet.  Hypertension: Continue hydralazine as needed Consider adding ACE inhibitor's in the light of proteinuria.  Asthma: Continue albuterol and home inhalers.  Proteinuria: Most likely from uncontrolled hypertension and diabetes Outpatient nephrology follow-up.  Hypertriglyceridemia: Start fenofibrate and statins when taking p.o.  AKI: Resolved with IV hydration  Discharge Instructions  Discharge Instructions    Call MD for:  difficulty breathing, headache or visual disturbances   Complete by: As directed    Call MD for:  persistant dizziness or light-headedness   Complete by: As directed    Call MD for:  persistant nausea and vomiting   Complete by: As directed    Diet - low sodium heart healthy   Complete by: As directed    Diet Carb Modified   Complete by: As directed    Discharge instructions   Complete by:  As directed    Follow-up with primary care physician in 1 week. Advised to follow-up with endocrinologist. Advised to take Lantus 14 units daily and NovoLog  units 3 times daily.   Increase activity slowly   Complete by: As directed      Allergies as of 04/17/2021   No Known Allergies     Medication List    STOP taking these medications   insulin aspart 100  UNIT/ML injection Commonly known as: novoLOG Replaced by: insulin aspart 100 UNIT/ML FlexPen   insulin lispro 100 UNIT/ML injection Commonly known as: HUMALOG   ondansetron 4 MG disintegrating tablet Commonly known as: Zofran ODT   ondansetron 4 MG tablet Commonly known as: ZOFRAN   pantoprazole 40 MG tablet Commonly known as: PROTONIX     TAKE these medications   albuterol 108 (90 Base) MCG/ACT inhaler Commonly known as: VENTOLIN HFA Inhale 1-2 puffs into the lungs every 6 (six) hours as needed for wheezing or shortness of breath.   atorvastatin 20 MG tablet Commonly known as: Lipitor Take 1 tablet (20 mg total) by mouth daily.   budesonide-formoterol 160-4.5 MCG/ACT inhaler Commonly known as: SYMBICORT Inhale 2 puffs into the lungs daily as needed (wheezing).   diphenhydrAMINE 25 MG tablet Commonly known as: BENADRYL Take 1 tablet (25 mg total) by mouth every 6 (six) hours as needed for itching or allergies.   fenofibrate 48 MG tablet Commonly known as: Tricor Take 1 tablet (48 mg total) by mouth daily.   insulin aspart 100 UNIT/ML FlexPen Commonly known as: NOVOLOG Inject 5 Units into the skin 3 (three) times daily with meals. Replaces: insulin aspart 100 UNIT/ML injection   insulin glargine 100 UNIT/ML Solostar Pen Commonly known as: LANTUS Inject 14 Units into the skin daily.   metFORMIN 1000 MG tablet Commonly known as: GLUCOPHAGE Take 1,000 mg by mouth 2 (two) times daily with a meal.       Follow-up Information    [facility] Follow up in 1 week(s).   Specialty: Pediatrics Contact information: [address] [place] [address] [phone]        Reather Littler, MD Follow up.   Specialty: Endocrinology Contact information: [address] [place] [address] [phone]              No Known Allergies  Consultations:  None   Procedures/Studies: No results  found.    Subjective: Patient was seen and examined at bedside.  Overnight events noted.  Patient reports feeling better,  sugars has been improving.  Patient wants to be discharged.  Discharge Exam: Vitals:   04/17/21 1100 04/17/21 1200  BP: 129/70 113/78  Pulse:  66  Resp: 13 14  Temp:  97.8 F (36.6 C)  SpO2:  98%   Vitals:   04/17/21 0900 04/17/21 1000 04/17/21 1100 04/17/21 1200  BP: 134/73 (!) 143/83 129/70 113/78  Pulse:    66  Resp: Temp:    97.8 F (36.6 C)  TempSrc:    Oral  SpO2:    98%  Weight:      Height:        General: Pt is alert, awake, not in acute distress Cardiovascular: RRR, S1/S2 +, no rubs, no gallops Respiratory: CTA bilaterally, no wheezing, no rhonchi Abdominal: Soft, NT, ND, bowel sounds + Extremities: no edema, no cyanosis    The results of significant diagnostics from this hospitalization (including imaging, microbiology,  ancillary and laboratory) are listed below for reference.     Microbiology: Recent Results (from the past 240 hour(s))  Resp Panel by RT-PCR (Flu A&B, Covid) Nasopharyngeal Swab     Status: None   Collection Time: 04/15/21 12:36 PM   Specimen: Nasopharyngeal Swab; Nasopharyngeal(NP) swabs in vial transport medium  Result Value Ref Range Status   SARS Coronavirus 2 by RT PCR NEGATIVE NEGATIVE Final    Comment: (NOTE) SARS-CoV-2 target nucleic acids are NOT DETECTED.  The SARS-CoV-2 RNA is generally detectable in upper respiratory specimens during the acute phase of infection. The lowest concentration of SARS-CoV-2 viral copies this assay can detect is 138 copies/mL. A negative result does not preclude SARS-Cov-2 infection and should not be used as the sole basis for treatment or other patient management decisions. A negative result may occur with  improper specimen collection/handling, submission of specimen other than nasopharyngeal swab, presence of viral mutation(s) within the areas targeted by  this assay, and inadequate number of viral copies(<138 copies/mL). A negative result must be combined with clinical observations, patient history, and epidemiological information. The expected result is Negative.  Fact Sheet for Patients:  BloggerCourse.com  Fact Sheet for Healthcare Providers:  SeriousBroker.it  This test is no t yet approved or cleared by the [place] FDA and  has been authorized for detection and/or diagnosis of SARS-CoV-2 by FDA under an Emergency Use Authorization (EUA). This EUA will remain  in effect (meaning this test can be used) for the duration of the COVID-19 declaration under Section 564(b)(1) of the Act, 21 U.S.C.section 360bbb-3(b)(1), unless the authorization is terminated  or revoked sooner.       Influenza A by PCR NEGATIVE NEGATIVE Final   Influenza B by PCR NEGATIVE NEGATIVE Final    Comment: (NOTE) The Xpert Xpress SARS-CoV-2/FLU/RSV plus assay is intended as an aid in the diagnosis of influenza from Nasopharyngeal swab specimens and should not be used as a sole basis for treatment. Nasal washings and aspirates are unacceptable for Xpert Xpress SARS-CoV-2/FLU/RSV testing.  Fact Sheet for Patients: BloggerCourse.com  Fact Sheet for Healthcare Providers: SeriousBroker.it  This test is not yet approved or cleared by the [place] FDA and has been authorized for detection and/or diagnosis of SARS-CoV-2 by FDA under an Emergency Use Authorization (EUA). This EUA will remain in effect (meaning this test can be used) for the duration of the COVID-19 declaration under Section 564(b)(1) of the Act, 21 U.S.C. section 360bbb-3(b)(1), unless the authorization is terminated or revoked.  Performed at [facility], [address] [place], [place] [address]   MRSA PCR Screening     Status: None   Collection Time: 04/15/21  3:00 PM    Specimen: Nasal Mucosa; Nasopharyngeal  Result Value Ref Range Status   MRSA by PCR NEGATIVE NEGATIVE Final    Comment:        The GeneXpert MRSA Assay (FDA approved for NASAL specimens only), is one component of a comprehensive MRSA colonization surveillance program. It is not intended to diagnose MRSA infection nor to guide or monitor treatment for MRSA infections. Performed at [facility], [address], [place], [place] [address]      Labs: BNP (last 3 results) No results for input(s): BNP in the last 8760 hours. Basic Metabolic Panel: Recent Labs  Lab 04/15/21 1052 04/15/21 1109 04/16/21 0810 04/16/21 1259 04/16/21 1550 04/16/21 2055 04/17/21 0254  NA 132*   < > 139 139 140 136 134*  K 5.2*   < > 3.7 3.0* 3.2* 3.4* 3.5  CL 101   < > 109 110 108 106 105  CO2 <7*   < > 17* 22 26 24 22   GLUCOSE 542*   < > 265* 169* 107* 241* 226*  BUN 26*   < > 22* 20 18 19 17   CREATININE 1.44*   < > 1.03 0.81 0.78 0.59* 0.60*  CALCIUM 9.2   < > 8.6* 8.7* 9.0 8.4* 8.3*  MG 3.2*  --   --   --   --   --  1.9  PHOS  --   --   --   --   --   --  2.3*   < > = values in this interval not displayed.   Liver Function Tests: Recent Labs  Lab 04/15/21 1052 04/15/21 1913  AST 21  --   ALT 19  --   ALKPHOS 122  --   BILITOT 1.6* 0.8  PROT 8.0  --   ALBUMIN 4.3  --    Recent Labs  Lab 04/15/21 1052  LIPASE 24   No results for input(s): AMMONIA in the last 168 hours. CBC: Recent Labs  Lab 04/15/21 1052 04/15/21 1109 04/15/21 1343  WBC 7.8  --   --   NEUTROABS 5.7  --   --   HGB 17.4* 19.4* 17.0  17.3*  HCT 52.7* 57.0* 50.0  51.0  MCV 77.4*  --   --   PLT 388  --   --    Cardiac Enzymes: No results for input(s): CKTOTAL, CKMB, CKMBINDEX, TROPONINI in the last 168 hours. BNP: Invalid input(s): POCBNP CBG: Recent Labs  Lab 04/16/21 1732 04/16/21 1803 04/16/21 2131 04/17/21 0754 04/17/21 1202  GLUCAP 263* 275* 335* 240* 288*    D-Dimer No results for input(s): DDIMER in the last 72 hours. Hgb A1c No results for input(s): HGBA1C in the last 72 hours. Lipid Profile Recent Labs    04/15/21 1052 04/16/21 0006  CHOL 748* 459*  HDL NOT REPORTED DUE TO HIGH TRIGLYCERIDES 39*  LDLCALC UNABLE TO CALCULATE IF TRIGLYCERIDE OVER 400 mg/dL UNABLE TO CALCULATE IF TRIGLYCERIDE OVER 400 mg/dL  TRIG 04/17/21* 04/18/21*  CHOLHDL NOT REPORTED DUE TO HIGH TRIGLYCERIDES 11.8  LDLDIRECT <10 50.7   Thyroid function studies No results for input(s): TSH, T4TOTAL, T3FREE, THYROIDAB in the last 72 hours.  Invalid input(s): FREET3 Anemia work up Recent Labs    04/15/21 1913  VITAMINB12 1,232*  FOLATE 23.2   Urinalysis    Component Value Date/Time   COLORURINE YELLOW 04/15/2021 1236   APPEARANCEUR CLEAR 04/15/2021 1236   LABSPEC >1.030 (H) 04/15/2021 1236   PHURINE 5.5 04/15/2021 1236   GLUCOSEU >=500 (A) 04/15/2021 1236   HGBUR SMALL (A) 04/15/2021 1236   BILIRUBINUR NEGATIVE 04/15/2021 1236   KETONESUR >80 (A) 04/15/2021 1236   PROTEINUR 100 (A) 04/15/2021 1236   NITRITE NEGATIVE 04/15/2021 1236   LEUKOCYTESUR NEGATIVE 04/15/2021 1236   Sepsis Labs Invalid input(s): PROCALCITONIN,  WBC,  LACTICIDVEN Microbiology Recent Results (from the past 240 hour(s))  Resp Panel by RT-PCR (Flu A&B, Covid) Nasopharyngeal Swab     Status: None   Collection Time: 04/15/21 12:36 PM   Specimen: Nasopharyngeal Swab; Nasopharyngeal(NP) swabs in vial transport medium  Result Value Ref Range Status   SARS Coronavirus 2 by RT PCR NEGATIVE NEGATIVE Final    Comment: (NOTE) SARS-CoV-2 target nucleic acids are NOT DETECTED.  The SARS-CoV-2 RNA is generally detectable  in upper respiratory specimens during the acute phase of infection. The lowest concentration of SARS-CoV-2 viral copies this assay can detect is 138 copies/mL. A negative result does not preclude SARS-Cov-2 infection and should not be used as the sole basis for treatment  or other patient management decisions. A negative result may occur with  improper specimen collection/handling, submission of specimen other than nasopharyngeal swab, presence of viral mutation(s) within the areas targeted by this assay, and inadequate number of viral copies(<138 copies/mL). A negative result must be combined with clinical observations, patient history, and epidemiological information. The expected result is Negative.  Fact Sheet for Patients:  BloggerCourse.com  Fact Sheet for Healthcare Providers:  SeriousBroker.it  This test is no t yet approved or cleared by the [place] FDA and  has been authorized for detection and/or diagnosis of SARS-CoV-2 by FDA under an Emergency Use Authorization (EUA). This EUA will remain  in effect (meaning this test can be used) for the duration of the COVID-19 declaration under Section 564(b)(1) of the Act, 21 U.S.C.section 360bbb-3(b)(1), unless the authorization is terminated  or revoked sooner.       Influenza A by PCR NEGATIVE NEGATIVE Final   Influenza B by PCR NEGATIVE NEGATIVE Final    Comment: (NOTE) The Xpert Xpress SARS-CoV-2/FLU/RSV plus assay is intended as an aid in the diagnosis of influenza from Nasopharyngeal swab specimens and should not be used as a sole basis for treatment. Nasal washings and aspirates are unacceptable for Xpert Xpress SARS-CoV-2/FLU/RSV testing.  Fact Sheet for Patients: BloggerCourse.com  Fact Sheet for Healthcare Providers: SeriousBroker.it  This test is not yet approved or cleared by the [place] FDA and has been authorized for detection and/or diagnosis of SARS-CoV-2 by FDA under an Emergency Use Authorization (EUA). This EUA will remain in effect (meaning this test can be used) for the duration of the COVID-19 declaration under Section 564(b)(1) of the Act, 21 U.S.C. section  360bbb-3(b)(1), unless the authorization is terminated or revoked.  Performed at [facility], [address] [place], [place] [address]   MRSA PCR Screening     Status: None   Collection Time: 04/15/21  3:00 PM   Specimen: Nasal Mucosa; Nasopharyngeal  Result Value Ref Range Status   MRSA by PCR NEGATIVE NEGATIVE Final    Comment:        The GeneXpert MRSA Assay (FDA approved for NASAL specimens only), is one component of a comprehensive MRSA colonization surveillance program. It is not intended to diagnose MRSA infection nor to guide or monitor treatment for MRSA infections. Performed at [facility], [address], [place], [place] [address]      Time coordinating discharge: Over 30 minutes  SIGNED:   Cipriano Bunker, MD  [facility] 04/17/2021, 3:19 PM Pager   If 7PM-7AM, please contact night-coverage [email or web address] Password [email or web address]

## 2021-04-17 NOTE — Discharge Instructions (Signed)
Follow-up with primary care physician in 1 week. Advised to follow-up with endocrinologist. Advised to take Lantus 14 units daily and NovoLog  units 3 times daily.

## 2021-04-17 NOTE — Progress Notes (Signed)
Inpatient Diabetes Program Recommendations  AACE/ADA: New Consensus Statement on Inpatient Glycemic Control (2015)  Target Ranges:  Prepandial:   less than 140 mg/dL      Peak postprandial:   less than 180 mg/dL (1-2 hours)      Critically ill patients:  140 - 180 mg/dL   Lab Results  Component Value Date   GLUCAP 240 (H) 04/17/2021    Review of Glycemic Control  Diabetes history: DM2 Outpatient Diabetes medications: Humalog 10-18 units TID with meals (s/s), Lantus 6 units QHS Current orders for Inpatient glycemic control: Lantus 10 units QD, Novolog 0-9 units TID with meals and 0-5 HS  HgbA1C - None  Inpatient Diabetes Program Recommendations:     Need HgbA1C to assess glycemic control PTA. Increase Lantus to 14 units QD Add Novolog 5 units TID with meals if eating > 50% meal  Spoke with pt today about his diabetes management at home. Pt states he lives in [place] and needs to obtain a PCP to manage his diabetes. Has not been to PCP or Endo in several years. Pt says he's been on vacation and did not take any insulin and didn't check blood sugars at all. "Usually I'm pretty good with checking blood sugar and taking my insulin, but I didn't on vacation. Pt states he's had occasional lows in the past and treats with 15 g CHO juice.  Discussed importance of keeping blood sugars in control to reduce risks of complications. Pt voiced understanding. Said he has insulin pen needles and monitoring supplies at home. May need prescription for Lantus and Humalog. Uses pens.   Continue to follow. Watch glucose trends.  Thank you. Ailene Ards, RD, LDN, CDE Inpatient Diabetes Coordinator [phone]

## 2021-04-17 NOTE — Progress Notes (Signed)
Discharge instructions reviewed, patient states he has no questions, IV access removed. Patient wheeled downstairs via wheelchair for discharge with his grandmother

## 2021-04-18 LAB — HEMOGLOBIN A1C
Hgb A1c MFr Bld: 14.8 % — ABNORMAL HIGH (ref 4.8–5.6)
Mean Plasma Glucose: 378 mg/dL

## 2021-04-22 LAB — VITAMIN B1: Vitamin B1 (Thiamine): 217.2 nmol/L — ABNORMAL HIGH (ref 66.5–200.0)
